# Patient Record
Sex: Female | Born: 1975 | Race: White | Hispanic: Yes | Marital: Married | Smoking: Former smoker
Health system: Southern US, Community
[De-identification: ages and names within clinical notes are randomized; demographics above are authoritative.]

## PROBLEM LIST (undated history)

## (undated) ENCOUNTER — Inpatient Hospital Stay (HOSPITAL_COMMUNITY): Payer: Self-pay

## (undated) DIAGNOSIS — Z789 Other specified health status: Secondary | ICD-10-CM

## (undated) DIAGNOSIS — N83209 Unspecified ovarian cyst, unspecified side: Secondary | ICD-10-CM

## (undated) DIAGNOSIS — G43909 Migraine, unspecified, not intractable, without status migrainosus: Secondary | ICD-10-CM

---

## 1999-05-03 ENCOUNTER — Inpatient Hospital Stay (HOSPITAL_COMMUNITY): Admission: AD | Admit: 1999-05-03 | Discharge: 1999-05-03 | Payer: Self-pay | Admitting: Obstetrics

## 1999-05-03 ENCOUNTER — Encounter: Payer: Self-pay | Admitting: Obstetrics & Gynecology

## 1999-05-11 ENCOUNTER — Encounter (HOSPITAL_COMMUNITY): Admission: RE | Admit: 1999-05-11 | Discharge: 1999-06-10 | Payer: Self-pay | Admitting: *Deleted

## 1999-06-08 ENCOUNTER — Inpatient Hospital Stay (HOSPITAL_COMMUNITY): Admission: AD | Admit: 1999-06-08 | Discharge: 1999-06-11 | Payer: Self-pay | Admitting: *Deleted

## 1999-06-15 ENCOUNTER — Inpatient Hospital Stay (HOSPITAL_COMMUNITY): Admission: AD | Admit: 1999-06-15 | Discharge: 1999-06-15 | Payer: Self-pay | Admitting: *Deleted

## 2000-10-16 ENCOUNTER — Encounter: Payer: Self-pay | Admitting: Emergency Medicine

## 2000-10-16 ENCOUNTER — Ambulatory Visit (HOSPITAL_COMMUNITY): Admission: AD | Admit: 2000-10-16 | Discharge: 2000-10-16 | Payer: Self-pay | Admitting: Obstetrics & Gynecology

## 2012-11-26 ENCOUNTER — Emergency Department (HOSPITAL_COMMUNITY)
Admission: EM | Admit: 2012-11-26 | Discharge: 2012-11-26 | Disposition: A | Discharge status: Home / Self Care | Payer: Self-pay | Source: Home / Self Care

## 2013-01-09 ENCOUNTER — Encounter (HOSPITAL_COMMUNITY): Payer: Self-pay | Admitting: Cardiology

## 2013-01-09 ENCOUNTER — Emergency Department (HOSPITAL_COMMUNITY): Payer: Medicaid Other

## 2013-01-09 ENCOUNTER — Emergency Department (HOSPITAL_COMMUNITY)
Admission: EM | Admit: 2013-01-09 | Discharge: 2013-01-09 | Disposition: A | Discharge status: Home / Self Care | Payer: Medicaid Other | Attending: Emergency Medicine | Admitting: Emergency Medicine

## 2013-01-09 DIAGNOSIS — O9989 Other specified diseases and conditions complicating pregnancy, childbirth and the puerperium: Secondary | ICD-10-CM | POA: Insufficient documentation

## 2013-01-09 DIAGNOSIS — Z3490 Encounter for supervision of normal pregnancy, unspecified, unspecified trimester: Secondary | ICD-10-CM

## 2013-01-09 DIAGNOSIS — R109 Unspecified abdominal pain: Secondary | ICD-10-CM | POA: Insufficient documentation

## 2013-01-09 LAB — CBC WITH DIFFERENTIAL/PLATELET
Eosinophils Absolute: 0.1 10*3/uL (ref 0.0–0.7)
Hemoglobin: 12.2 g/dL (ref 12.0–15.0)
Lymphocytes Relative: 30 % (ref 12–46)
Lymphs Abs: 2.2 10*3/uL (ref 0.7–4.0)
MCH: 29.2 pg (ref 26.0–34.0)
MCV: 84.9 fL (ref 78.0–100.0)
Monocytes Relative: 5 % (ref 3–12)
Neutrophils Relative %: 63 % (ref 43–77)
Platelets: 209 10*3/uL (ref 150–400)
RBC: 4.18 MIL/uL (ref 3.87–5.11)
WBC: 7.3 10*3/uL (ref 4.0–10.5)

## 2013-01-09 LAB — URINALYSIS, ROUTINE W REFLEX MICROSCOPIC
Bilirubin Urine: NEGATIVE
Ketones, ur: NEGATIVE mg/dL
Nitrite: NEGATIVE
Protein, ur: NEGATIVE mg/dL
Specific Gravity, Urine: 1.02 (ref 1.005–1.030)
Urobilinogen, UA: 0.2 mg/dL (ref 0.0–1.0)

## 2013-01-09 LAB — WET PREP, GENITAL: Trich, Wet Prep: NONE SEEN

## 2013-01-09 LAB — COMPREHENSIVE METABOLIC PANEL
ALT: 26 U/L (ref 0–35)
Alkaline Phosphatase: 72 U/L (ref 39–117)
BUN: 8 mg/dL (ref 6–23)
CO2: 22 mEq/L (ref 19–32)
GFR calc Af Amer: >90 mL/min (ref >90)
GFR calc non Af Amer: >90 mL/min (ref >90)
Glucose, Bld: 102 mg/dL — ABNORMAL HIGH (ref 70–99)
Potassium: 3.8 mEq/L (ref 3.5–5.1)
Sodium: 138 mEq/L (ref 135–145)
Total Bilirubin: 0.5 mg/dL (ref 0.3–1.2)

## 2013-01-09 LAB — URINE MICROSCOPIC-ADD ON

## 2013-01-09 LAB — POCT PREGNANCY, URINE: Preg Test, Ur: POSITIVE — AB

## 2013-01-09 LAB — ABO/RH

## 2013-01-09 LAB — LIPASE, BLOOD: Lipase: 31 U/L (ref 11–59)

## 2013-01-09 MED ORDER — MORPHINE SULFATE 4 MG/ML IJ SOLN
4.0000 mg | Freq: Once | INTRAMUSCULAR | Status: AC
  Administered 2013-01-09: 4 mg via INTRAVENOUS
  Filled 2013-01-09: qty 1

## 2013-01-09 MED ORDER — PRENATAL COMPLETE 14-0.4 MG PO TABS: 1.0000 | ORAL_TABLET | Freq: Every day | ORAL | Status: DC

## 2013-01-09 MED ORDER — ONDANSETRON HCL 4 MG/2ML IJ SOLN
4.0000 mg | Freq: Once | INTRAMUSCULAR | Status: AC
  Administered 2013-01-09: 4 mg via INTRAVENOUS
  Filled 2013-01-09: qty 2

## 2013-01-09 MED ORDER — IOHEXOL 300 MG/ML  SOLN
20.0000 mL | INTRAMUSCULAR | Status: AC
  Administered 2013-01-09: 25 mL via ORAL

## 2013-01-09 MED ORDER — SODIUM CHLORIDE 0.9 % IV BOLUS (SEPSIS)
1000.0000 mL | Freq: Once | INTRAVENOUS | Status: AC
  Administered 2013-01-09: 1000 mL via INTRAVENOUS

## 2013-01-09 NOTE — ED Provider Notes (Signed)
History     CSN: [id number]  Arrival date & time 01/09/13  0818   First MD Initiated Contact with Patient 01/09/13 [phone]      Chief Complaint  Patient presents with  . Abdominal Pain    (Consider location/radiation/quality/duration/timing/severity/associated sxs/prior treatment) HPI Comments: Patient presents with diffuse abdominal pain that started this morning. It is in the center of her abdomen and radiates down her right side. She denies any dysuria or hematuria. She denies any nausea, vomiting or stool changes. No fever. She recently started taking birth control attempt to regulate her periods. She denies any provoking or palliating factors. She denies any chest pain or shortness of breath. She's never had pain like this before. There appears to be no association with food.  The history is provided by the patient and a relative. The history is limited by a language barrier.    History reviewed. No pertinent past medical history.  History reviewed. No pertinent past surgical history.  History reviewed. No pertinent family history.  History  Substance Use Topics  . Smoking status: Never Smoker   . Smokeless tobacco: Not on file  . Alcohol Use: No    OB History   Grav Para Term Preterm Abortions TAB SAB Ect Mult Living                  Review of Systems  Constitutional: Positive for activity change and appetite change. Negative for fever.  HENT: Negative for congestion and rhinorrhea.   Respiratory: Negative for cough, chest tightness and shortness of breath.   Cardiovascular: Negative for chest pain.  Gastrointestinal: Positive for abdominal pain. Negative for nausea, vomiting and diarrhea.  Genitourinary: Negative for dysuria, hematuria, vaginal bleeding and vaginal discharge.  Musculoskeletal: Positive for back pain.  Neurological: Negative for headaches.  A complete 10 system review of systems was obtained and all systems are negative except as noted in the HPI  and PMH.    Allergies  Review of patient's allergies indicates no known allergies.  Home Medications   Current Outpatient Rx  Name  Route  Sig  Dispense  Refill  . Norethindrone-Eth Estradiol (NORTREL 1/35, 28, PO)   Oral   Take 1 tablet by mouth daily.         . Ranitidine HCl (ZANTAC PO)   Oral   Take 1-2 tablets by mouth daily as needed. For stomach.           BP 125/77  Pulse 58  Temp(Src) 98.2 F (36.8 C) (Oral)  SpO2 100%  Physical Exam  Constitutional: She is oriented to person, place, and time. She appears well-developed and well-nourished. No distress.  HENT:  Head: Normocephalic and atraumatic.  Mouth/Throat: No oropharyngeal exudate.  Eyes: Conjunctivae and EOM are normal. Pupils are equal, round, and reactive to light.  Neck: Normal range of motion. Neck supple.  Cardiovascular: Normal rate, regular rhythm and normal heart sounds.   No murmur heard. Pulmonary/Chest: Effort normal and breath sounds normal. No respiratory distress.  Abdominal: Soft. There is tenderness. There is no rebound and no guarding.  TTP epigastrum, RLQ. No guarding or rebound  Genitourinary: No vaginal discharge found.  Cervix closed, no CMT. Mild L adnexal tenderness, no masses  Musculoskeletal: Normal range of motion. She exhibits no edema and no tenderness.  Neurological: She is alert and oriented to person, place, and time. No cranial nerve deficit. She exhibits normal muscle tone. Coordination normal.  Skin: Skin is warm.    ED Course  Procedures (including critical care time)  Labs Reviewed  CBC WITH DIFFERENTIAL - Abnormal; Notable for the following:    HCT 35.5 (*)    All other components within normal limits  COMPREHENSIVE METABOLIC PANEL - Abnormal; Notable for the following:    Glucose, Bld 102 (*)    All other components within normal limits  URINALYSIS, ROUTINE W REFLEX MICROSCOPIC - Abnormal; Notable for the following:    APPearance HAZY (*)    Hgb urine  dipstick LARGE (*)    All other components within normal limits  URINE MICROSCOPIC-ADD ON - Abnormal; Notable for the following:    Squamous Epithelial / LPF FEW (*)    Bacteria, UA FEW (*)    All other components within normal limits  POCT PREGNANCY, URINE - Abnormal; Notable for the following:    Preg Test, Ur POSITIVE (*)    All other components within normal limits  WET PREP, GENITAL  GC/CHLAMYDIA PROBE AMP  LIPASE, BLOOD  HCG, QUANTITATIVE, PREGNANCY  HCG, QUANTITATIVE, PREGNANCY  ABO/RH   US Ob Comp Less 14 Wks  01/09/2013  *RADIOLOGY REPORT*  Clinical Data: Abdominal pain.  Vaginal bleeding.  5-week-6-day gestational age by LMP.  Previous history of ectopic pregnancy.  OBSTETRIC <14 WK [place] AND TRANSVAGINAL OB US  Technique:  Both transabdominal and transvaginal ultrasound examinations were performed for complete evaluation of the gestation as well as the maternal uterus, adnexal regions, and pelvic cul-de-sac.  Transvaginal technique was performed to assess early pregnancy.  Comparison:  None.  Findings: No gestational sac or other fluid collections seen within the endometrial cavity.  Endometrial thickness measures 12 mm transvaginally.  No fibroids identified.  Prior C-section scar noted.  The left ovary is normal in appearance.  A simple right ovarian cyst is seen measuring 2.6 cm.  No suspicious adnexal masses are identified there is no evidence of free fluid.  IMPRESSION: No IUP or adnexal mass identified.  Differential diagnosis includes recent spontaneous abortion, IUP too early to visualize, and occult ectopic pregnancy.  Recommend close follow-up of quantitative B-HCG levels, and followup US as clinically warranted.   Original Report Authenticated By: Myles Rosenthal, M.D.    US Ob Transvaginal  01/09/2013  *RADIOLOGY REPORT*  Clinical Data: Abdominal pain.  Vaginal bleeding.  5-week-6-day gestational age by LMP.  Previous history of ectopic pregnancy.  OBSTETRIC <14 WK [place] AND  TRANSVAGINAL OB US  Technique:  Both transabdominal and transvaginal ultrasound examinations were performed for complete evaluation of the gestation as well as the maternal uterus, adnexal regions, and pelvic cul-de-sac.  Transvaginal technique was performed to assess early pregnancy.  Comparison:  None.  Findings: No gestational sac or other fluid collections seen within the endometrial cavity.  Endometrial thickness measures 12 mm transvaginally.  No fibroids identified.  Prior C-section scar noted.  The left ovary is normal in appearance.  A simple right ovarian cyst is seen measuring 2.6 cm.  No suspicious adnexal masses are identified there is no evidence of free fluid.  IMPRESSION: No IUP or adnexal mass identified.  Differential diagnosis includes recent spontaneous abortion, IUP too early to visualize, and occult ectopic pregnancy.  Recommend close follow-up of quantitative B-HCG levels, and followup US as clinically warranted.   Original Report Authenticated By: Myles Rosenthal, M.D.      No diagnosis found.    MDM  Patient with diffuse abdominal pain that started this morning it radiates across her entire abdomen. No other symptoms. Recently started birth control to regulate  her periods.  She has right lower quadrant tenderness on exam and is in her epigastrium. There is no guarding or rebound. Patient's UA is negative. Her pregnancy test was found to be positive.  No IUP or mass seen on ultrasound.  Abdominal pain has resolved. Patient's quant is 147. She's had an ectopic in the past. Discussed with Dr. Clearance Coots as well as Dr. Tamela Oddi. the patient will need repeat hCG in 2 days. Patient and family express understanding through the translator that this may be an ectopic pregnancy, a miscarriage, or a normal pregnancy and followup is needed.      Glynn Octave, MD 01/09/13 [phone]

## 2013-01-09 NOTE — ED Notes (Signed)
Pt reports generalized abd pain that started this morning. Reports that the pain started in the center of abd and radiated around into her back. Denies any urinary symptoms.

## 2013-01-09 NOTE — ED Notes (Addendum)
Called lab, states have specimen to run hCG.

## 2013-01-09 NOTE — ED Notes (Addendum)
Spoke with mini lab about green tube lab collected today, they will found and run the HCG.

## 2013-01-11 LAB — GC/CHLAMYDIA PROBE AMP
CT Probe RNA: NEGATIVE
GC Probe RNA: NEGATIVE

## 2013-04-01 ENCOUNTER — Ambulatory Visit: Payer: Medicaid Other | Attending: Orthopedic Surgery | Admitting: Occupational Therapy

## 2013-04-01 DIAGNOSIS — M6281 Muscle weakness (generalized): Secondary | ICD-10-CM | POA: Insufficient documentation

## 2013-04-01 DIAGNOSIS — IMO0001 Reserved for inherently not codable concepts without codable children: Secondary | ICD-10-CM | POA: Insufficient documentation

## 2013-04-01 DIAGNOSIS — M25549 Pain in joints of unspecified hand: Secondary | ICD-10-CM | POA: Insufficient documentation

## 2013-04-15 ENCOUNTER — Ambulatory Visit: Payer: Medicaid Other | Admitting: Occupational Therapy

## 2013-04-23 ENCOUNTER — Ambulatory Visit: Payer: Medicaid Other | Admitting: Occupational Therapy

## 2013-04-30 ENCOUNTER — Ambulatory Visit: Payer: Medicaid Other | Attending: Orthopedic Surgery | Admitting: Occupational Therapy

## 2013-04-30 DIAGNOSIS — M25549 Pain in joints of unspecified hand: Secondary | ICD-10-CM | POA: Insufficient documentation

## 2013-04-30 DIAGNOSIS — IMO0001 Reserved for inherently not codable concepts without codable children: Secondary | ICD-10-CM | POA: Insufficient documentation

## 2013-04-30 DIAGNOSIS — M6281 Muscle weakness (generalized): Secondary | ICD-10-CM | POA: Insufficient documentation

## 2013-08-26 ENCOUNTER — Other Ambulatory Visit: Payer: Self-pay | Admitting: Orthopedic Surgery

## 2013-08-26 DIAGNOSIS — R2231 Localized swelling, mass and lump, right upper limb: Secondary | ICD-10-CM

## 2013-08-28 ENCOUNTER — Ambulatory Visit
Admission: RE | Admit: 2013-08-28 | Discharge: 2013-08-28 | Disposition: A | Discharge status: Home / Self Care | Payer: Medicaid Other | Source: Ambulatory Visit | Attending: Orthopedic Surgery | Admitting: Orthopedic Surgery

## 2013-08-28 DIAGNOSIS — R2231 Localized swelling, mass and lump, right upper limb: Secondary | ICD-10-CM

## 2013-08-28 MED ORDER — GADOBENATE DIMEGLUMINE 529 MG/ML IV SOLN
15.0000 mL | Freq: Once | INTRAVENOUS | Status: AC | PRN
  Administered 2013-08-28: 15 mL via INTRAVENOUS

## 2014-04-10 IMAGING — US US OB COMP LESS 14 WK
1 series · 14 of 28 positions shown · non-contrast
Comparison: None.

CLINICAL DATA: Abdominal pain.  Vaginal bleeding.  5-week-8-day
gestational age by LMP.  Previous history of ectopic pregnancy.

OBSTETRIC <14 WK US AND TRANSVAGINAL OB US
TECHNIQUE: Both transabdominal and transvaginal ultrasound
examinations were performed for complete evaluation of the
gestation as well as the maternal uterus, adnexal regions, and
pelvic cul-de-sac.  Transvaginal technique was performed to assess
early pregnancy.

[Series 1: us ob comp less 14 wk · 0.28mm/px · 14 of 68 slices shown]
[im 3/68]
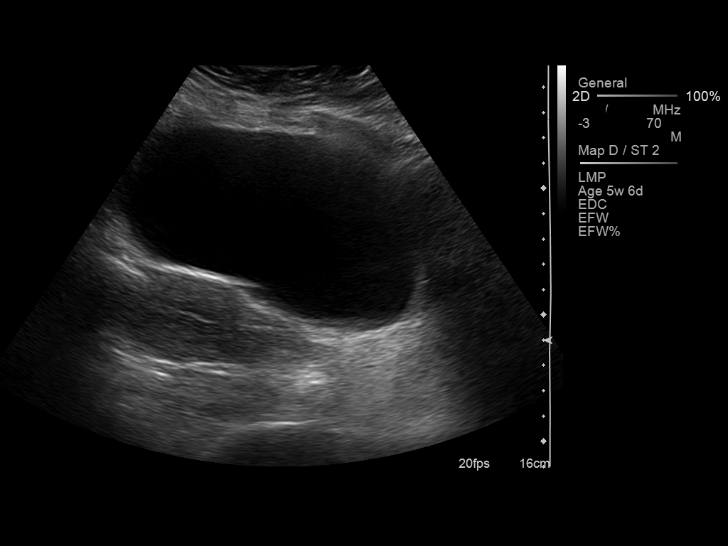
[im 8/68]
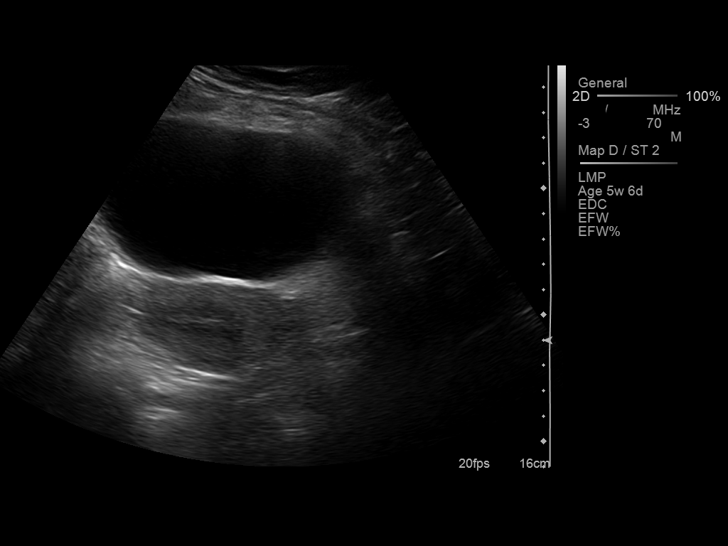
[im 13/68]
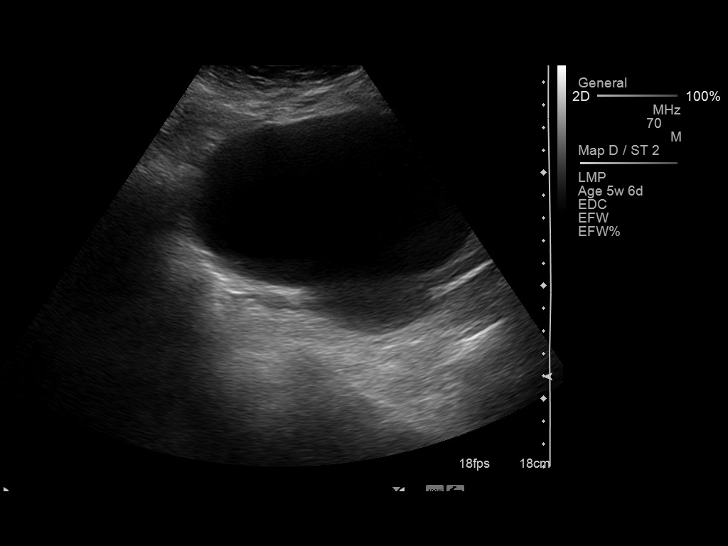
[im 18/68]
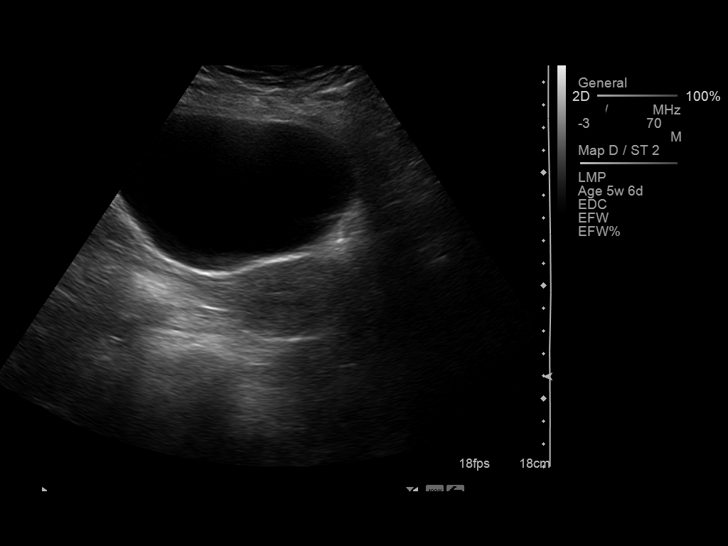
[im 23/68]
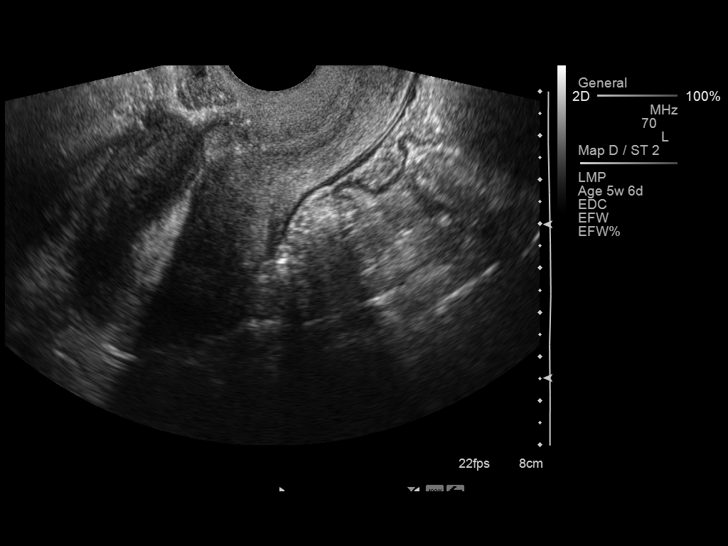
[im 28/68]
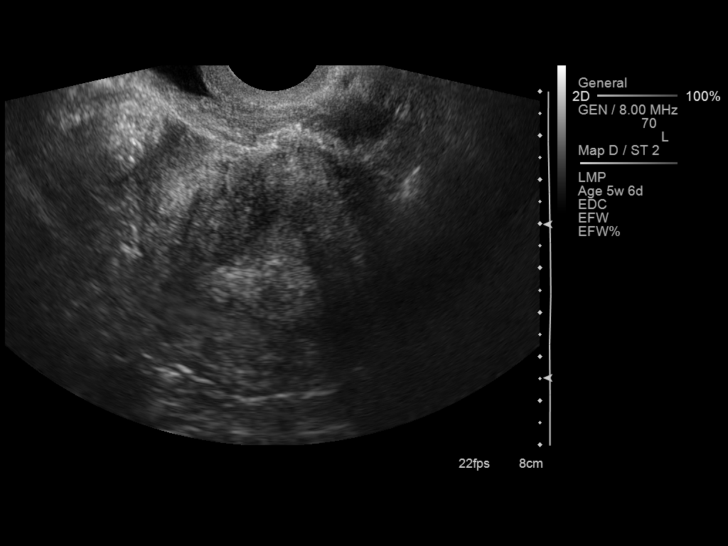
[im 33/68]
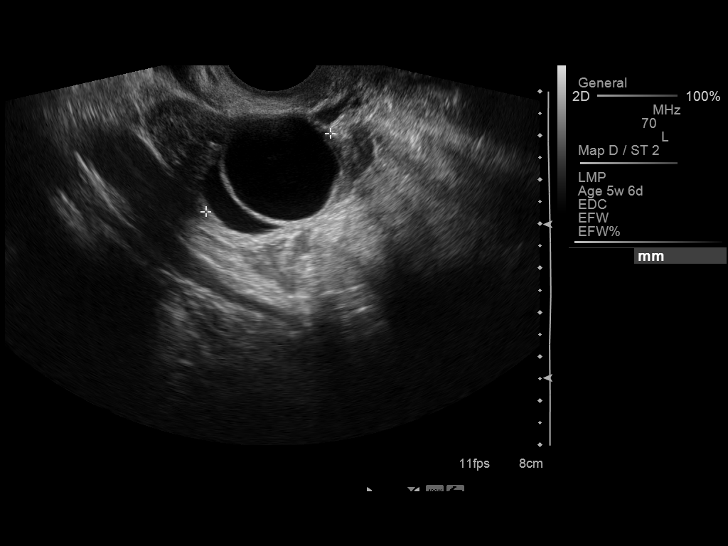
[im 38/68]
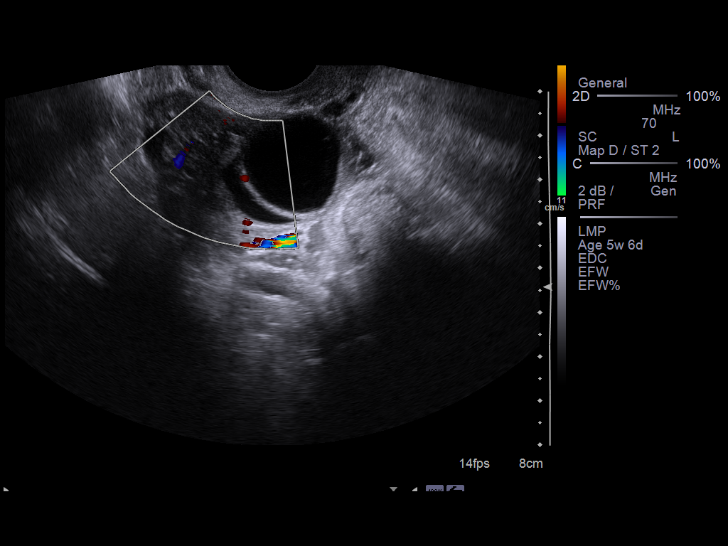
[im 43/68]
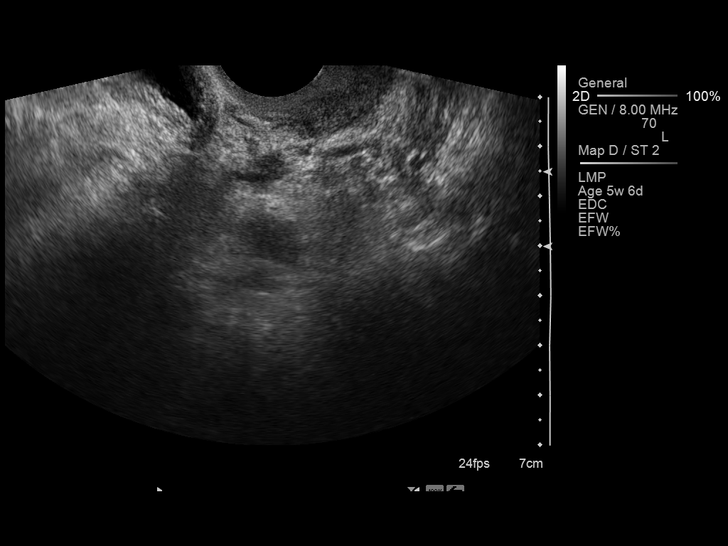
[im 48/68]
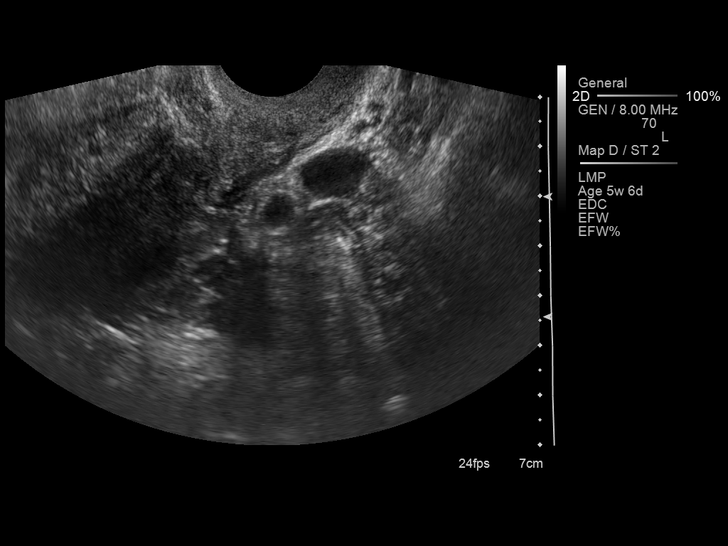
[im 53/68]
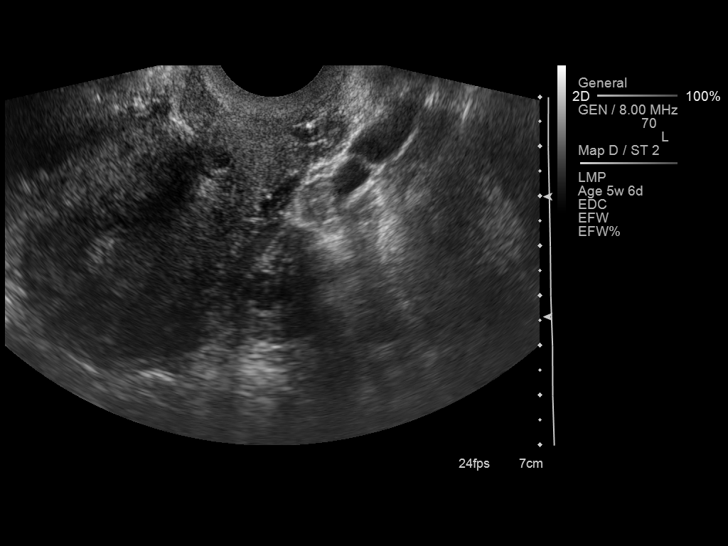
[im 58/68]
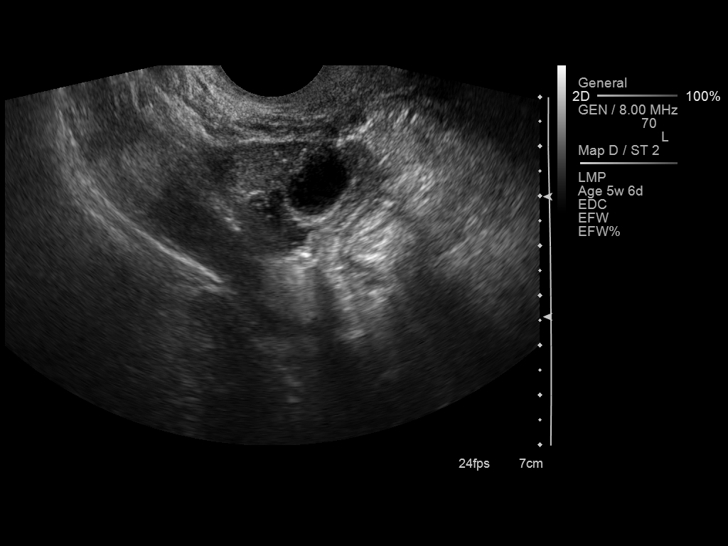
[im 63/68]
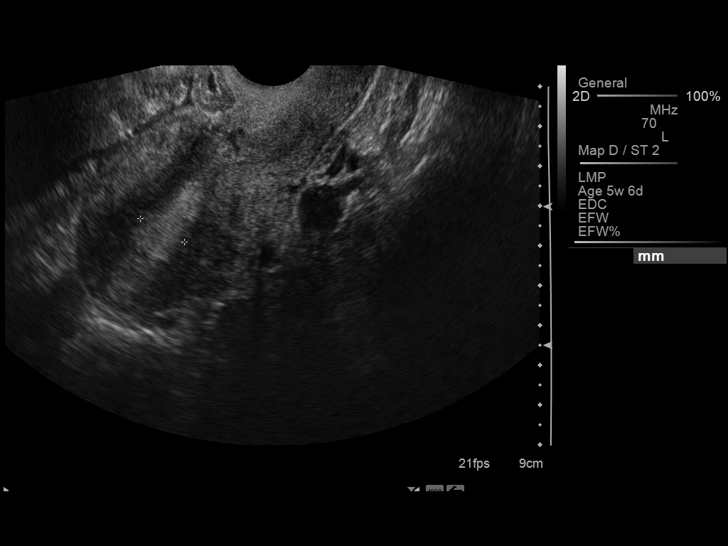
[im 68/68]
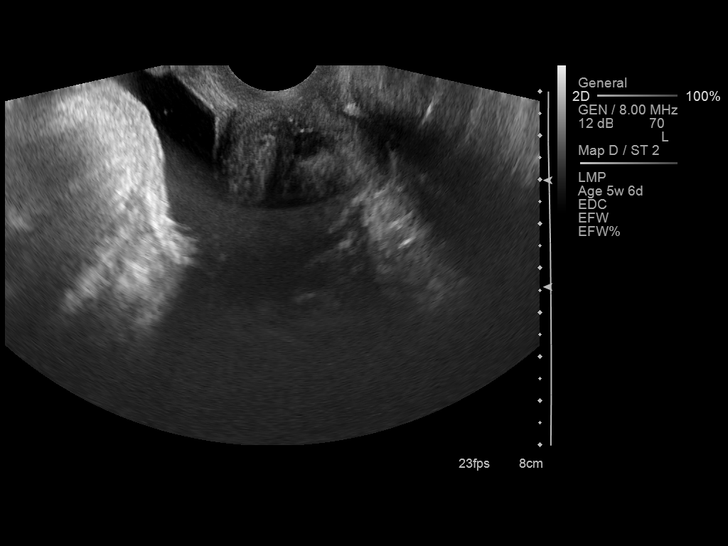

[14 of 28 positions shown; findings below may reference images not displayed]

FINDINGS: No gestational sac or other fluid collections seen within
the endometrial cavity.  Endometrial thickness measures 12 mm
transvaginally.  No fibroids identified.  Prior C-section scar
noted.

The left ovary is normal in appearance.  A simple right ovarian
cyst is seen measuring 2.6 cm.  No suspicious adnexal masses are
identified there is no evidence of free fluid.
IMPRESSION: No IUP or adnexal mass identified.  Differential diagnosis includes
recent spontaneous abortion, IUP too early to visualize, and occult
ectopic pregnancy.  Recommend close follow-up of quantitative B-HCG
levels, and followup US as clinically warranted.

## 2015-05-22 ENCOUNTER — Inpatient Hospital Stay (HOSPITAL_COMMUNITY): Payer: Medicaid Other

## 2015-05-22 ENCOUNTER — Inpatient Hospital Stay (HOSPITAL_COMMUNITY)
Admission: AD | Admit: 2015-05-22 | Discharge: 2015-05-22 | Disposition: A | Discharge status: Home / Self Care | Payer: Medicaid Other | Source: Ambulatory Visit | Attending: Obstetrics and Gynecology | Admitting: Obstetrics and Gynecology

## 2015-05-22 ENCOUNTER — Emergency Department (HOSPITAL_COMMUNITY)
Admission: EM | Admit: 2015-05-22 | Discharge: 2015-05-22 | Discharge status: Absent without leave | Payer: Medicaid Other

## 2015-05-22 ENCOUNTER — Encounter (HOSPITAL_COMMUNITY): Payer: Self-pay | Admitting: *Deleted

## 2015-05-22 DIAGNOSIS — R109 Unspecified abdominal pain: Secondary | ICD-10-CM

## 2015-05-22 DIAGNOSIS — Z3A21 21 weeks gestation of pregnancy: Secondary | ICD-10-CM | POA: Insufficient documentation

## 2015-05-22 DIAGNOSIS — O9989 Other specified diseases and conditions complicating pregnancy, childbirth and the puerperium: Secondary | ICD-10-CM | POA: Diagnosis not present

## 2015-05-22 DIAGNOSIS — Z87891 Personal history of nicotine dependence: Secondary | ICD-10-CM | POA: Diagnosis not present

## 2015-05-22 DIAGNOSIS — O26872 Cervical shortening, second trimester: Secondary | ICD-10-CM

## 2015-05-22 DIAGNOSIS — R1030 Lower abdominal pain, unspecified: Secondary | ICD-10-CM | POA: Diagnosis present

## 2015-05-22 DIAGNOSIS — O26899 Other specified pregnancy related conditions, unspecified trimester: Secondary | ICD-10-CM

## 2015-05-22 DIAGNOSIS — O26879 Cervical shortening, unspecified trimester: Secondary | ICD-10-CM | POA: Insufficient documentation

## 2015-05-22 HISTORY — DX: Unspecified ovarian cyst, unspecified side: N83.209

## 2015-05-22 HISTORY — DX: Other specified health status: Z78.9

## 2015-05-22 LAB — CBC
HCT: 33.1 % — ABNORMAL LOW (ref 36.0–46.0)
HEMOGLOBIN: 11.4 g/dL — AB (ref 12.0–15.0)
MCH: 29.5 pg (ref 26.0–34.0)
MCHC: 34.4 g/dL (ref 30.0–36.0)
MCV: 85.8 fL (ref 78.0–100.0)
Platelets: 186 10*3/uL (ref 150–400)
RBC: 3.86 MIL/uL — ABNORMAL LOW (ref 3.87–5.11)
RDW: 15.9 % — AB (ref 11.5–15.5)
WBC: 8.4 10*3/uL (ref 4.0–10.5)

## 2015-05-22 LAB — URINALYSIS, ROUTINE W REFLEX MICROSCOPIC
Bilirubin Urine: NEGATIVE
Glucose, UA: NEGATIVE mg/dL
HGB URINE DIPSTICK: NEGATIVE
Ketones, ur: 15 mg/dL — AB
Leukocytes, UA: NEGATIVE
NITRITE: NEGATIVE
PROTEIN: NEGATIVE mg/dL
Specific Gravity, Urine: 1.02 (ref 1.005–1.030)
UROBILINOGEN UA: 0.2 mg/dL (ref 0.0–1.0)
pH: 5.5 (ref 5.0–8.0)

## 2015-05-22 NOTE — ED Notes (Signed)
Called x's 3 with no response

## 2015-05-22 NOTE — Discharge Instructions (Signed)
Dolor abdominal durante el embarazo °(Abdominal Pain During Pregnancy) °El dolor de vientre (abdominal) es habitual durante el embarazo. Generalmente no se trata de un problema grave. Otras veces puede ser un signo de que algo no anda bien. Siempre comuníquese con su médico si tiene dolor abdominal. °CUIDADOS EN EL HOGAR °Controle el dolor para ver si hay cambios. Las indicaciones que siguen pueden ayudarla a sentirse mejor: °· Notenga sexo (relaciones sexuales) ni se coloque nada dentro de la vagina hasta que se sienta mejor. °· Haga reposo hasta que el dolor se calme. °· Si siente ganas de vomitar (náuseas ) beba líquidos claros. No consuma alimentos sólidos hasta que se sienta mejor. °· Sólo tome los medicamentos que le haya indicado su médico. °· Cumpla con las visitas al médico según las indicaciones. °SOLICITE AYUDA DE INMEDIATO SI:  °· Tiene un sangrado, pierde líquido o elimina trozos de tejido por la vagina. °· Siente más dolor o cólicos. °· Comienza a vomitar. °· Siente dolor al orinar u observa sangre en la orina. °· Tiene fiebre. °· No siente que el bebé se mueva mucho. °· Se siente muy débil o cree que va a desmayarse. °· Tiene dificultad para respirar con o sin dolor en el vientre. °· Siente un dolor de cabeza muy intenso y dolor en el vientre. °· Observa que sale un líquido por la vagina y tiene dolor abdominal. °· La materia fecal es líquida (diarrea). °· El dolor en el viente no desaparece, o empeora, luego de hacer reposo. °ASEGÚRESE DE QUE:  °· Comprende estas instrucciones. °· Controlará su afección. °· Recibirá ayuda de inmediato si no mejora o si empeora. °Document Released: 07/27/2011 Document Revised: 07/17/2013 °ExitCare® Patient Information ©2015 [employer]. This information is not intended to replace advice given to you by your health care provider. Make sure you discuss any questions you have with your health care provider. ° °

## 2015-05-22 NOTE — MAU Provider Note (Signed)
History     CSN: [id number]  Arrival date and time: 05/22/15 1530   First Provider Initiated Contact with Patient 05/22/15 1706      Chief Complaint  Patient presents with  . Abdominal Pain   HPI  Caitlin Velasquez is a 39 y.o. [id number] at [redacted]w[redacted]d here with report of lower abdominal pain after eating.  Pain is described as sharp and intermittent in nature.  Uterus tightens up and she feels a pressure that gravitates up abdomen.  Denies nausea, vomiting, diarrhea, vaginal bleeding, leaking of fluid, UTI symptoms, or abnormal vaginal discharge.  Receives prenatal care in [place] with Dr. Shawnie Pons.  Hx of four previous csections with no report of problems this pregnancy.     Past Medical History  Diagnosis Date  . Medical history non-contributory   . Ovarian cyst     Past Surgical History  Procedure Laterality Date  . Cesarean section      No family history on file.  History  Substance Use Topics  . Smoking status: Former Smoker    Quit date: 05/21/2012  . Smokeless tobacco: Not on file  . Alcohol Use: No    Allergies: No Known Allergies  Prescriptions prior to admission  Medication Sig Dispense Refill Last Dose  . Prenatal Vit-Fe Fumarate-FA (PRENATAL COMPLETE) 14-0.4 MG TABS Take 1 tablet by mouth daily. 60 each 0 05/22/2015 at Unknown time    Review of Systems  Constitutional: Negative for fever and chills.  Gastrointestinal: Positive for abdominal pain. Negative for nausea, vomiting, diarrhea and constipation.  Genitourinary: Negative for dysuria, urgency and frequency.  All other systems reviewed and are negative.  Physical Exam   Last menstrual period 12/02/2014.  Physical Exam  Constitutional: She is oriented to person, place, and time. She appears well-developed and well-nourished.  HENT:  Head: Normocephalic.  Neck: Normal range of motion. Neck supple.  Cardiovascular: Normal rate, regular rhythm and normal heart sounds.   Respiratory: Effort  normal and breath sounds normal.  GI: Soft. She exhibits no mass. There is tenderness (along mdiline of abdomen). There is no rebound and no guarding.  Genitourinary: No bleeding in the vagina. Vaginal discharge (mucusy) found.  Neurological: She is alert and oriented to person, place, and time.  Skin: Skin is warm and dry.   FHR 142    Dilation: Closed Effacement (%): Thick Cervical Position: Posterior Exam by:: Margarita Mail, CNM    MAU Course  Procedures Results for orders placed or performed during the hospital encounter of 05/22/15 (from the past 24 hour(s))  Urinalysis, Routine w reflex microscopic (not at [facility])     Status: Abnormal   Collection Time: 05/22/15  3:45 PM  Result Value Ref Range   Color, Urine YELLOW YELLOW   APPearance CLEAR CLEAR   Specific Gravity, Urine 1.020 1.005 - 1.030   pH 5.5 5.0 - 8.0   Glucose, UA NEGATIVE NEGATIVE mg/dL   Hgb urine dipstick NEGATIVE NEGATIVE   Bilirubin Urine NEGATIVE NEGATIVE   Ketones, ur 15 (A) NEGATIVE mg/dL   Protein, ur NEGATIVE NEGATIVE mg/dL   Urobilinogen, UA 0.2 0.0 - 1.0 mg/dL   Nitrite NEGATIVE NEGATIVE   Leukocytes, UA NEGATIVE NEGATIVE   CBC    Component Value Date/Time   WBC 8.4 05/22/2015 1746   RBC 3.86* 05/22/2015 1746   HGB 11.4* 05/22/2015 1746   HCT 33.1* 05/22/2015 1746   PLT 186 05/22/2015 1746   MCV 85.8 05/22/2015 1746   MCH 29.5 05/22/2015 1746  MCHC 34.4 05/22/2015 1746   RDW 15.9* 05/22/2015 1746   LYMPHSABS 2.2 01/09/2013 0833   MONOABS 0.4 01/09/2013 0833   EOSABS 0.1 01/09/2013 0833   BASOSABS 0.0 01/09/2013 0833    Cervical length 4.72  Assessment and Plan  39 y.o. [id number] at [redacted]w[redacted]d IUP Abdominal Pain in Pregnancy - Normal Exam  Plan: Discharge to home Pregnancy precautions Keep scheduled appt with Dr. Hiram Comber Kennith Gain, CNM

## 2015-05-22 NOTE — MAU Note (Signed)
Pt stated after she eats she gets a pain in her lower stomach that then makes her stomach hard and her  feels hot all over. Has had this feeling for about 2 weeks. Pt just found out she was pregnant this week.

## 2015-12-08 DIAGNOSIS — Z23 Encounter for immunization: Secondary | ICD-10-CM

## 2015-12-08 NOTE — Congregational Nurse Program (Signed)
Congregational Nurse Program Note  Date of Encounter: 12/08/2015  Past Medical History: Past Medical History  Diagnosis Date  . Medical history non-contributory   . Ovarian cyst     Encounter Details:     CNP Questionnaire - 12/08/15 1754    Patient Demographics   Is this a new or existing patient? New   Patient is considered a/an Immigrant   Race Latino/Hispanic   Patient Assistance   Location of Patient Assistance [facility]   Patient's financial/insurance status Medicaid   Uninsured Patient No   Patient referred to apply for the following financial assistance Medicaid   Food insecurities addressed Provided food supplies   Transportation assistance No   Assistance securing medications No   Educational health offerings Hypertension;Behavioral health   Encounter Details   Primary purpose of visit Other   Was an Emergency Department visit averted? Not Applicable   Does patient have a medical provider? No   Patient referred to Clinic;Doctor referral for a non-emergent behavioral health crisis   Was a mental health screening completed? (GAINS tool) No  post partum depression screening, gyn completed one today per client   Does patient have dental issues? No   Since previous encounter, have you referred patient for abnormal blood pressure that resulted in a new diagnosis or medication change? No   Since previous encounter, have you referred patient for abnormal blood glucose that resulted in a new diagnosis or medication change? No     vs  161/99 pulse 84 this afternoon.  Pt. Comes for flu shot which was given.  Brought 16 yr. Old daughter.  Did not administer flu shot to her. She has a pediatrician at [facility].  BP is high today. Client takes no medications.  Is crying and upset.  Lots of financial issues and hardships. Husband is in prison. Client is not working. [facility] staff is working with her about these problems.  16 yr. Speaks English, mother not. There  is also an 11 yr. Old in the home.  Mother breastfeeding.  Says she left the baby which is 2 mos. Old with a neighbor.  Mother is breastfeeding.   Dr. Donzetta Kohutore is Gynecologist and a screening (mother could not say the name of it) was done today at her post partum visit.  Referred to [facility] to receive ongoing healthcare.  She said she will call for an appt.  Gave name,address and flyer on [facility] location to client. RN mentioned post partum depresssion and its' seriousness and follow up with MD is very important.

## 2016-03-26 ENCOUNTER — Encounter (HOSPITAL_COMMUNITY): Payer: Self-pay | Admitting: *Deleted

## 2016-08-20 IMAGING — US US MFM OB TRANSVAGINAL
1 series · 14 of 19 positions shown · non-contrast
Comparison: none

[Series 1: us ob transvaginal · 19 acquisitions, 14 frames shown]
[im 1/19]
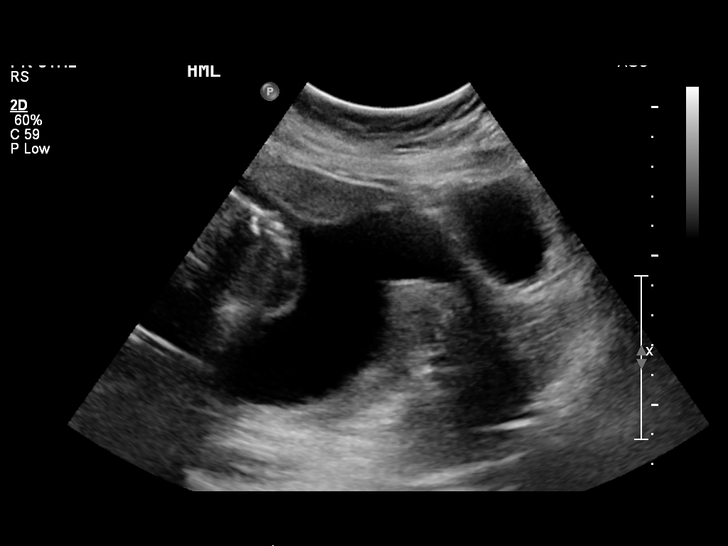
[im 3/19]
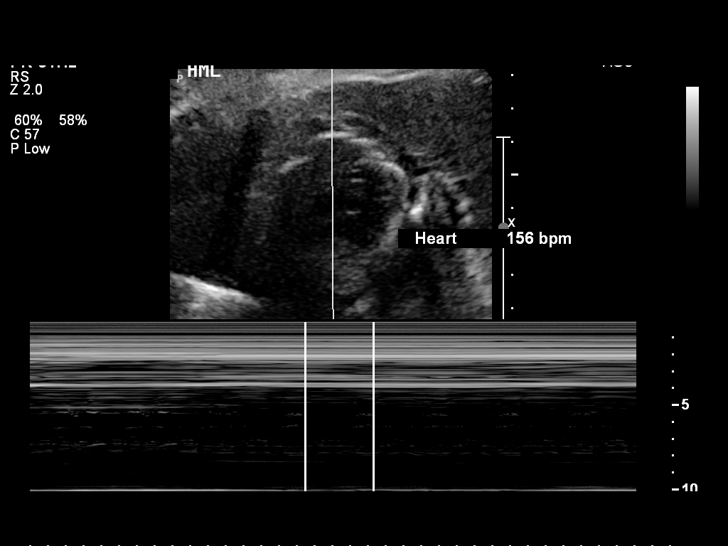
[im 4/19]
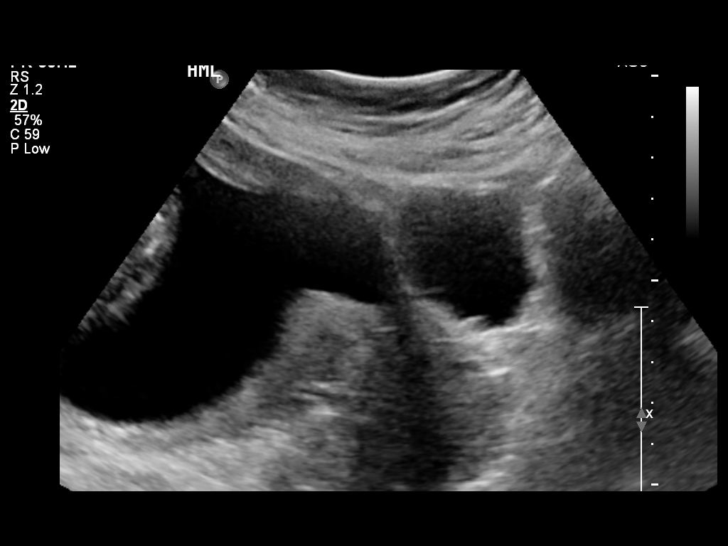
[im 5/19]
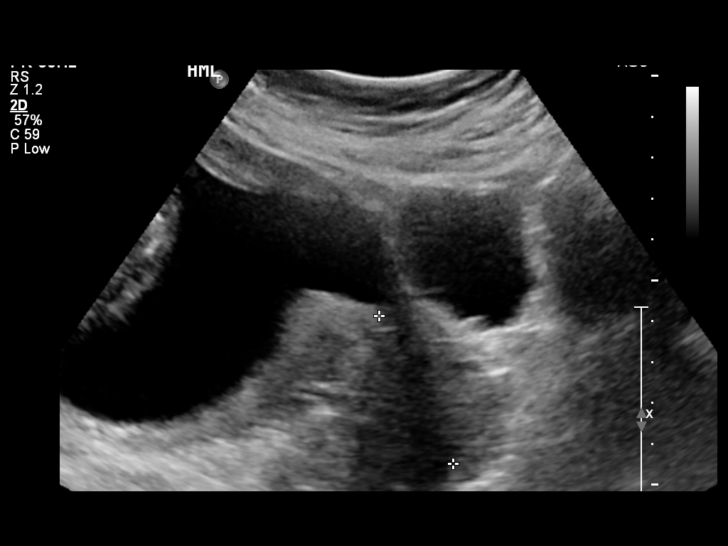
[im 7/19]
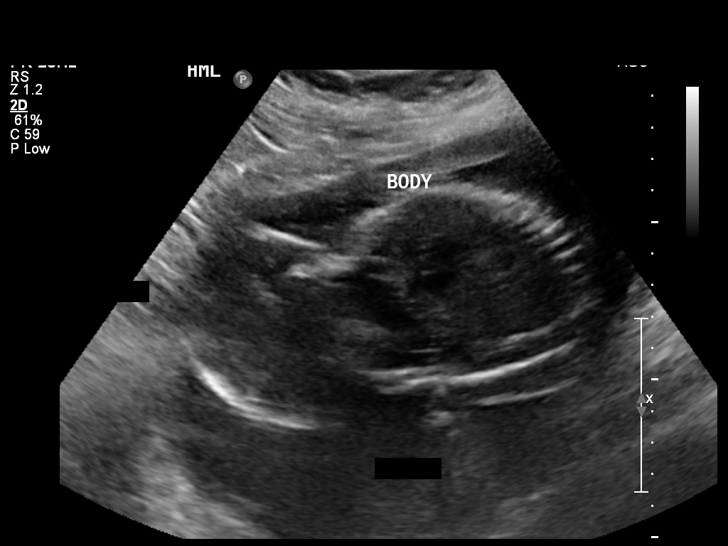
[im 8/19]
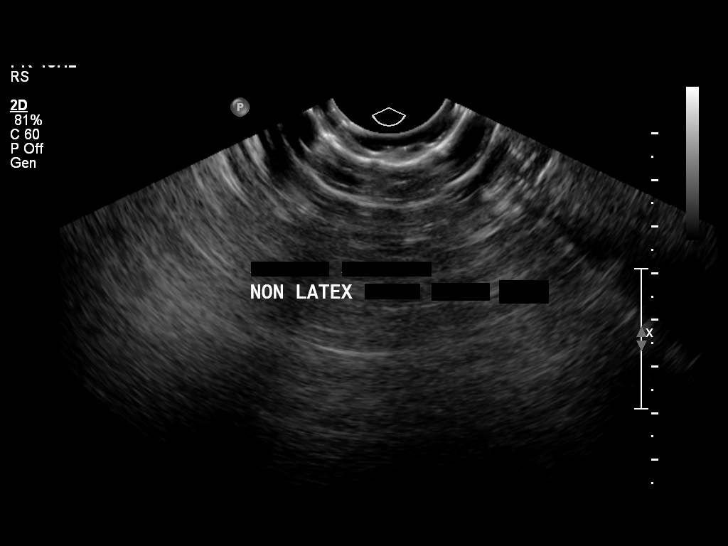
[im 9/19]
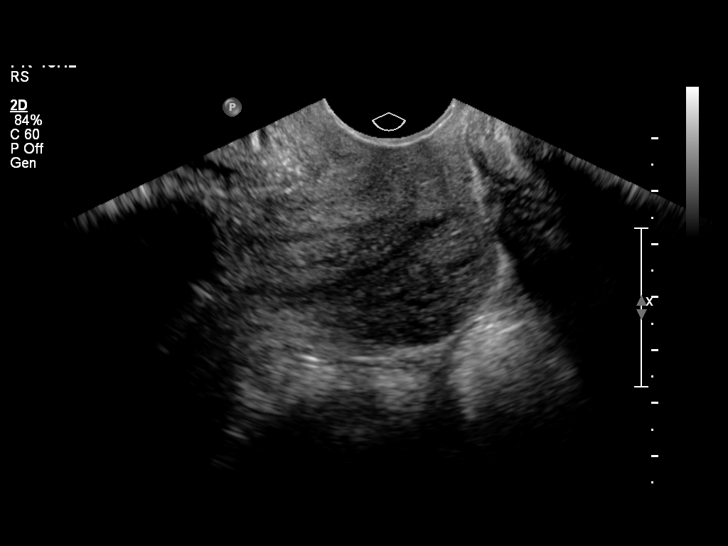
[im 11/19]
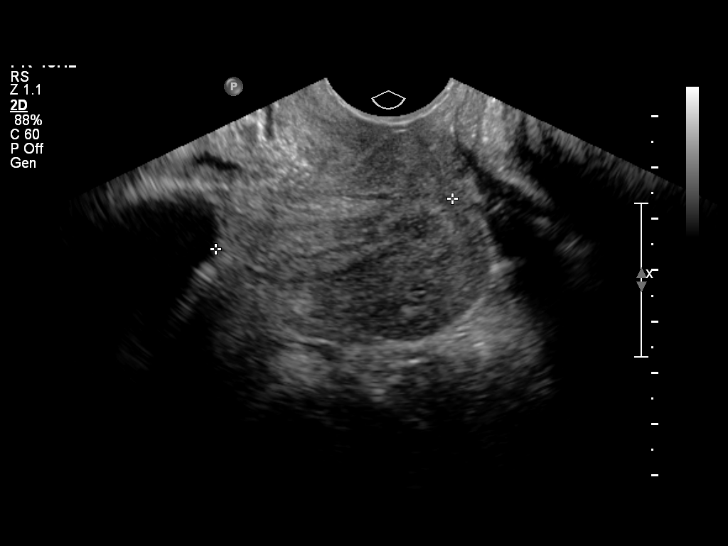
[im 12/19]
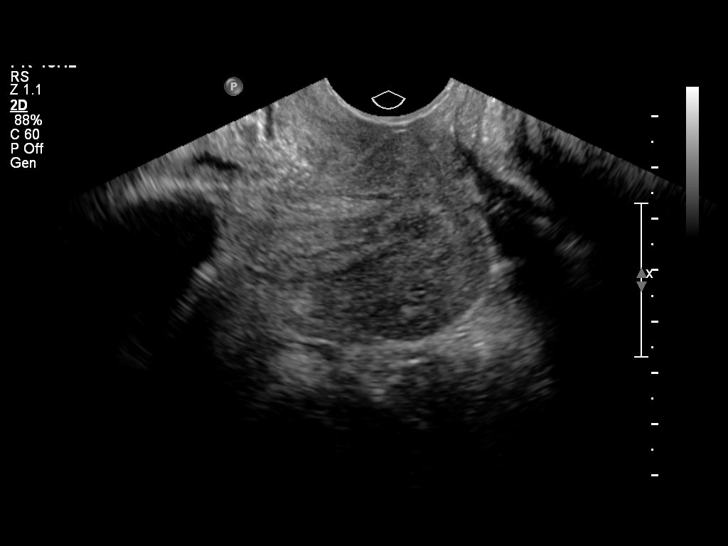
[im 13/19]
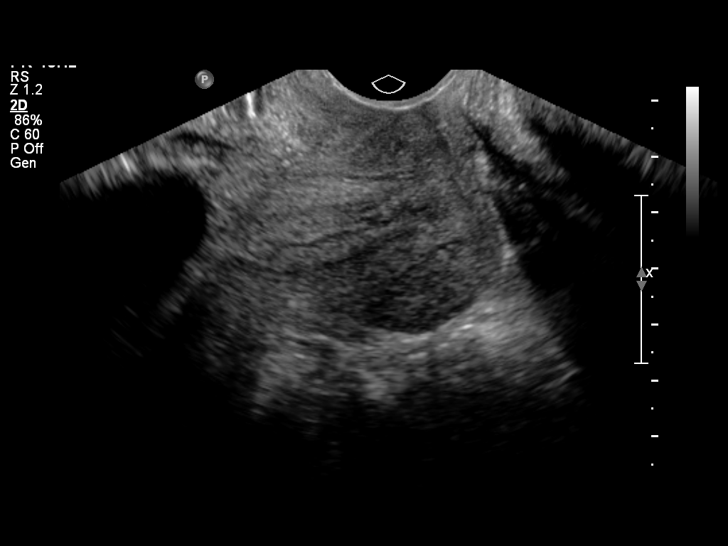
[im 15/19]
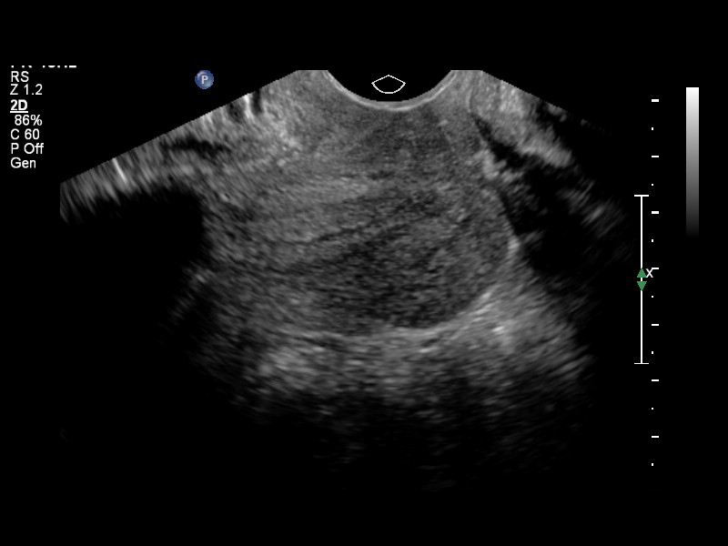
[im 16/19]
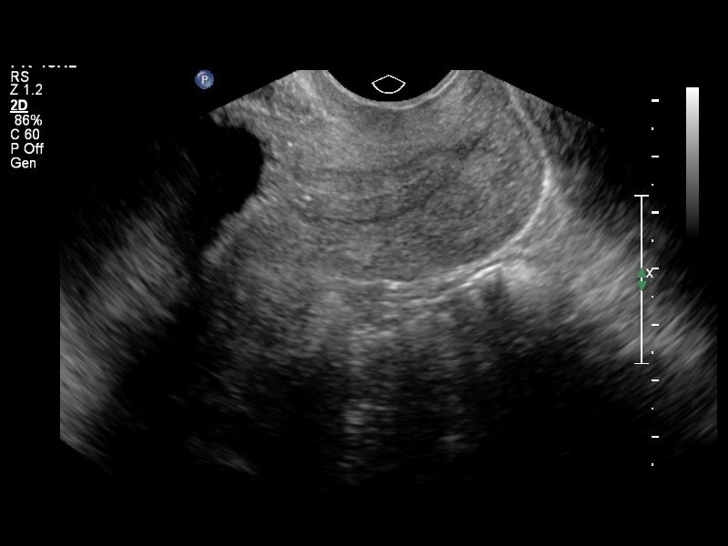
[im 17/19]
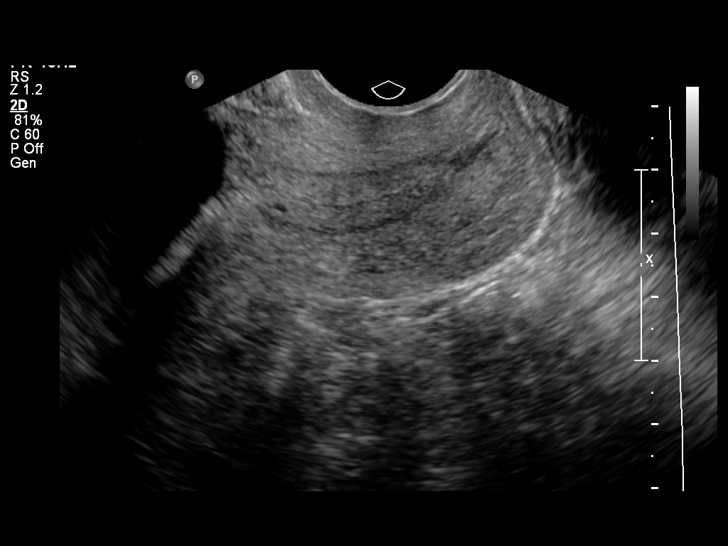
[im 19/19]
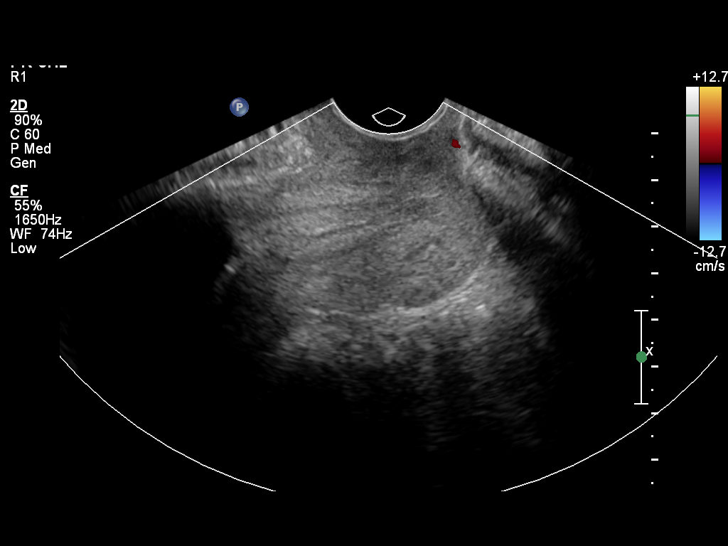

[14 of 19 positions shown; findings below may reference images not displayed]

OBSTETRICS REPORT
(Signed Final 05/22/2015 [DATE])

Service(s) Provided

US MFM OB TRANSVAGINAL                                76817.2
Indications

Abdominal pain in pregnancy
Cervical shortening, second trimester
21 weeks gestation of pregnancy
Fetal Evaluation

Num Of Fetuses:    1
Fetal Heart Rate:  156                          bpm
Cardiac Activity:  Observed
Presentation:      Transverse, head to
maternal left
Gestational Age

LMP:           24w 3d        Date:  12/02/14                 EDD:   09/08/15
Clinical EDD:  21w 2d                                        EDD:   09/30/15
Best:          21w 2d     Det. By:  Clinical EDD             EDD:   09/30/15
Cervix Uterus Adnexa

Cervical Length:    4.72      cm

Cervix:       Normal appearance by transvaginal scan
Impression

SIUP at 21+2 weeks
EV views of cervix: normal length without funneling
Recommendations

Follow-up as clinically indicated

## 2017-10-23 ENCOUNTER — Ambulatory Visit (HOSPITAL_COMMUNITY): Admission: EM | Admit: 2017-10-23 | Discharge: 2017-10-23 | Discharge status: Against Medical Advice | Payer: Self-pay

## 2018-07-06 DIAGNOSIS — H1013 Acute atopic conjunctivitis, bilateral: Secondary | ICD-10-CM | POA: Diagnosis not present

## 2018-07-06 DIAGNOSIS — H5203 Hypermetropia, bilateral: Secondary | ICD-10-CM | POA: Diagnosis not present

## 2019-09-22 ENCOUNTER — Other Ambulatory Visit: Payer: Self-pay

## 2019-09-22 ENCOUNTER — Ambulatory Visit (HOSPITAL_COMMUNITY)
Admission: EM | Admit: 2019-09-22 | Discharge: 2019-09-22 | Disposition: A | Discharge status: Home / Self Care | Payer: Medicaid Other | Attending: Urgent Care | Admitting: Urgent Care

## 2019-09-22 ENCOUNTER — Encounter (HOSPITAL_COMMUNITY): Payer: Self-pay

## 2019-09-22 DIAGNOSIS — L03311 Cellulitis of abdominal wall: Secondary | ICD-10-CM

## 2019-09-22 DIAGNOSIS — R109 Unspecified abdominal pain: Secondary | ICD-10-CM

## 2019-09-22 MED ORDER — NAPROXEN 500 MG PO TABS: 500.0000 mg | ORAL_TABLET | Freq: Two times a day (BID) | ORAL | 0 refills | Status: DC

## 2019-09-22 MED ORDER — DOXYCYCLINE HYCLATE 100 MG PO CAPS: 100.0000 mg | ORAL_CAPSULE | Freq: Two times a day (BID) | ORAL | 0 refills | Status: DC

## 2019-09-22 NOTE — ED Triage Notes (Signed)
Patient presents to Urgent Care with complaints of abscess on right abdomen since 5 days ago. Patient reports it is not open or draining.

## 2019-09-22 NOTE — ED Provider Notes (Signed)
MRN: [id number] DOB: Dec 31, 1975  Subjective:   Caitlin Velasquez is a 43 y.o. female presenting for 5-day history of persistent redness and pain of the right lower side of her abdomen.  Patient states that she was home remedies to try and help her infection but has not had any improvement.  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  She comes into contact with many dirty linens and also has to use harsh chemicals, states that her hands are irritated from this.  Denies fever, nausea, vomiting, drainage of pus or bleeding.  No current facility-administered medications for this encounter.   Current Outpatient Medications:  .  Prenatal Vit-Fe Fumarate-FA (PRENATAL COMPLETE) 14-0.4 MG TABS, Take 1 tablet by mouth daily., Disp: 60 each, Rfl: 0   No Known Allergies  Past Medical History:  Diagnosis Date  . Medical history non-contributory   . Ovarian cyst      Past Surgical History:  Procedure Laterality Date  . CESAREAN SECTION      ROS  Objective:   Vitals: BP (!) 146/86 (BP Location: Right Arm)   Pulse 76   Temp 98.6 F (37 C)   SpO2 100%   Physical Exam Constitutional:      General: She is not in acute distress.    Appearance: Normal appearance. She is well-developed and normal weight. She is not ill-appearing, toxic-appearing or diaphoretic.  HENT:     Head: Normocephalic and atraumatic.     Right Ear: External ear normal.     Left Ear: External ear normal.     Nose: Nose normal.     Mouth/Throat:     Mouth: Mucous membranes are moist.     Pharynx: Oropharynx is clear.  Eyes:     General: No scleral icterus.    Extraocular Movements: Extraocular movements intact.     Pupils: Pupils are equal, round, and reactive to light.  Cardiovascular:     Rate and Rhythm: Normal rate and regular rhythm.     Pulses: Normal pulses.     Heart sounds: Normal heart sounds. No murmur. No friction rub. No gallop.   Pulmonary:     Effort:  Pulmonary effort is normal. No respiratory distress.     Breath sounds: Normal breath sounds. No stridor. No wheezing, rhonchi or rales.  Abdominal:     General: Bowel sounds are normal. There is no distension.     Palpations: Abdomen is soft. There is no mass.     Tenderness: There is abdominal tenderness (about her cellulitis only). There is no right CVA tenderness, left CVA tenderness, guarding or rebound.  Skin:    General: Skin is warm and dry.     Coloration: Skin is not pale.     Findings: No rash.       Neurological:     General: No focal deficit present.     Mental Status: She is alert and oriented to person, place, and time.  Psychiatric:        Mood and Affect: Mood normal.        Behavior: Behavior normal.        Thought Content: Thought content normal.        Judgment: Judgment normal.     Assessment and Plan :   1. Cellulitis of abdominal wall   2. Abdominal wall pain     Will cover for cellulitis with doxycycline, warm compresses.  Use naproxen for pain and inflammation.  Wound is not amenable to I&D.  We will have patient maintain close follow-up and counseled on signs and symptoms warranting recheck for consideration of an I&D.  Recommended patient use hydrocortisone over-the-counter for what I suspect is developing dyshidrotic eczema.  Abided with work note for rest for the next couple of days. Counseled patient on potential for adverse effects with medications prescribed/recommended today, ER and return-to-clinic precautions discussed, patient verbalized understanding.    Jaynee Eagles, [place] 09/22/19 1731

## 2019-10-23 DIAGNOSIS — H25013 Cortical age-related cataract, bilateral: Secondary | ICD-10-CM | POA: Diagnosis not present

## 2019-10-23 DIAGNOSIS — H16223 Keratoconjunctivitis sicca, not specified as Sjogren's, bilateral: Secondary | ICD-10-CM | POA: Diagnosis not present

## 2019-10-23 DIAGNOSIS — H40013 Open angle with borderline findings, low risk, bilateral: Secondary | ICD-10-CM | POA: Diagnosis not present

## 2019-10-23 DIAGNOSIS — H2513 Age-related nuclear cataract, bilateral: Secondary | ICD-10-CM | POA: Diagnosis not present

## 2019-11-05 DIAGNOSIS — H2513 Age-related nuclear cataract, bilateral: Secondary | ICD-10-CM | POA: Diagnosis not present

## 2019-11-05 DIAGNOSIS — H1045 Other chronic allergic conjunctivitis: Secondary | ICD-10-CM | POA: Diagnosis not present

## 2019-11-05 DIAGNOSIS — H52203 Unspecified astigmatism, bilateral: Secondary | ICD-10-CM | POA: Diagnosis not present

## 2019-11-05 DIAGNOSIS — H40013 Open angle with borderline findings, low risk, bilateral: Secondary | ICD-10-CM | POA: Diagnosis not present

## 2019-11-05 DIAGNOSIS — J300 Vasomotor rhinitis: Secondary | ICD-10-CM | POA: Diagnosis not present

## 2019-11-05 DIAGNOSIS — H16223 Keratoconjunctivitis sicca, not specified as Sjogren's, bilateral: Secondary | ICD-10-CM | POA: Diagnosis not present

## 2019-11-05 DIAGNOSIS — H5203 Hypermetropia, bilateral: Secondary | ICD-10-CM | POA: Diagnosis not present

## 2019-11-05 DIAGNOSIS — H25013 Cortical age-related cataract, bilateral: Secondary | ICD-10-CM | POA: Diagnosis not present

## 2019-11-05 DIAGNOSIS — H524 Presbyopia: Secondary | ICD-10-CM | POA: Diagnosis not present

## 2021-05-07 ENCOUNTER — Other Ambulatory Visit: Payer: Self-pay

## 2021-05-07 ENCOUNTER — Encounter (HOSPITAL_COMMUNITY): Payer: Self-pay

## 2021-05-07 ENCOUNTER — Ambulatory Visit (HOSPITAL_COMMUNITY)
Admission: EM | Admit: 2021-05-07 | Discharge: 2021-05-07 | Disposition: A | Discharge status: Home / Self Care | Payer: Medicaid Other | Attending: Urgent Care | Admitting: Urgent Care

## 2021-05-07 DIAGNOSIS — J0100 Acute maxillary sinusitis, unspecified: Secondary | ICD-10-CM

## 2021-05-07 DIAGNOSIS — Z87891 Personal history of nicotine dependence: Secondary | ICD-10-CM | POA: Diagnosis not present

## 2021-05-07 DIAGNOSIS — Z20822 Contact with and (suspected) exposure to covid-19: Secondary | ICD-10-CM | POA: Diagnosis not present

## 2021-05-07 DIAGNOSIS — H9203 Otalgia, bilateral: Secondary | ICD-10-CM | POA: Diagnosis not present

## 2021-05-07 DIAGNOSIS — J3489 Other specified disorders of nose and nasal sinuses: Secondary | ICD-10-CM | POA: Diagnosis not present

## 2021-05-07 DIAGNOSIS — R519 Headache, unspecified: Secondary | ICD-10-CM | POA: Diagnosis present

## 2021-05-07 LAB — SARS CORONAVIRUS 2 (TAT 6-24 HRS): SARS Coronavirus 2: NEGATIVE

## 2021-05-07 LAB — POCT RAPID STREP A, ED / UC: Streptococcus, Group A Screen (Direct): NEGATIVE

## 2021-05-07 MED ORDER — AMOXICILLIN-POT CLAVULANATE 875-125 MG PO TABS: 1.0000 | ORAL_TABLET | Freq: Two times a day (BID) | ORAL | 0 refills | Status: DC

## 2021-05-07 MED ORDER — CETIRIZINE HCL 10 MG PO TABS: 10.0000 mg | ORAL_TABLET | Freq: Every day | ORAL | 0 refills | Status: AC

## 2021-05-07 MED ORDER — PSEUDOEPHEDRINE HCL 60 MG PO TABS: 60.0000 mg | ORAL_TABLET | Freq: Three times a day (TID) | ORAL | 0 refills | Status: DC | PRN

## 2021-05-07 NOTE — ED Triage Notes (Signed)
Pt reports headache x 2 weeks; sore throat, bilateral ear pain and feeling dizzy since this morning.

## 2021-05-07 NOTE — ED Provider Notes (Signed)
[facility]   MRN: [id number] DOB: 1976/08/01  Subjective:   Caitlin Velasquez is a 45 y.o. female presenting for 2-week history of persistent headache, dizziness, throat pain, bilateral ear fullness and pain, bilateral ear itching, started feeling dizzy this morning.  Patient has been using over-the-counter medications with minimal relief.  Denies confusion, chest pain, shortness of breath, coughing.  Denies having any fevers.  She is not currently taking any medications.    No Known Allergies  Past Medical History:  Diagnosis Date   Medical history non-contributory    Ovarian cyst      Past Surgical History:  Procedure Laterality Date   CESAREAN SECTION     CESAREAN SECTION      Family History  Problem Relation Age of Onset   Diabetes Mother    Hypertension Mother    Hypertension Father     Social History   Tobacco Use   Smoking status: Former    Pack years: 0.00    Types: Cigarettes    Quit date: 05/21/2012    Years since quitting: 8.9   Smokeless tobacco: Never  Substance Use Topics   Alcohol use: No   Drug use: No    ROS   Objective:   Vitals: BP (!) 145/99 (BP Location: Right Arm)   Pulse 86   Temp 100.3 F (37.9 C) (Oral)   Resp 20   SpO2 96%   Breastfeeding No   Physical Exam Constitutional:      General: She is not in acute distress.    Appearance: Normal appearance. She is well-developed. She is not ill-appearing, toxic-appearing or diaphoretic.  HENT:     Head: Normocephalic and atraumatic.     Right Ear: Ear canal and external ear normal.     Left Ear: Ear canal and external ear normal.     Ears:     Comments: TM slightly injected bilaterally.    Nose: Congestion and rhinorrhea present.     Comments: Right-sided maxillary sinus tenderness.    Mouth/Throat:     Mouth: Mucous membranes are moist.  Eyes:     General: No scleral icterus.       Right eye: No discharge.        Left eye: No discharge.      Extraocular Movements: Extraocular movements intact.     Conjunctiva/sclera: Conjunctivae normal.     Pupils: Pupils are equal, round, and reactive to light.  Cardiovascular:     Rate and Rhythm: Normal rate and regular rhythm.     Pulses: Normal pulses.     Heart sounds: Normal heart sounds. No murmur heard.   No friction rub. No gallop.  Pulmonary:     Effort: Pulmonary effort is normal. No respiratory distress.     Breath sounds: Normal breath sounds. No stridor. No wheezing, rhonchi or rales.  Skin:    General: Skin is warm and dry.     Findings: No rash.  Neurological:     Mental Status: She is alert and oriented to person, place, and time.     Cranial Nerves: No cranial nerve deficit.     Motor: No weakness.     Coordination: Coordination normal.     Gait: Gait normal.     Deep Tendon Reflexes: Reflexes normal.     Comments: Negative Kernig and Brudzinski.  Psychiatric:        Mood and Affect: Mood normal.        Behavior: Behavior  normal.        Thought Content: Thought content normal.        Judgment: Judgment normal.    Results for orders placed or performed during the hospital encounter of 05/07/21 (from the past 24 hour(s))  POCT Rapid Strep A (ED/UC)     Status: None   Collection Time: 05/07/21  1:13 PM  Result Value Ref Range   Streptococcus, Group A Screen (Direct) NEGATIVE NEGATIVE    Assessment and Plan :   PDMP not reviewed this encounter.  1. Acute non-recurrent maxillary sinusitis   2. Stuffy and runny nose   3. Acute ear pain, bilateral     COVID-19 testing pending. Will start empiric treatment for sinusitis with Augmentin.  Recommended supportive care otherwise including the use of oral antihistamine, decongestant. Counseled patient on potential for adverse effects with medications prescribed/recommended today, ER and return-to-clinic precautions discussed, patient verbalized understanding.    Wallis Bamberg, PA-C 05/07/21 1356

## 2021-05-09 LAB — CULTURE, GROUP A STREP (THRC)

## 2021-05-19 ENCOUNTER — Other Ambulatory Visit: Payer: Self-pay

## 2021-05-19 ENCOUNTER — Emergency Department (HOSPITAL_COMMUNITY)
Admission: EM | Admit: 2021-05-19 | Discharge: 2021-05-20 | Disposition: A | Discharge status: Home / Self Care | Payer: Medicaid Other | Attending: Emergency Medicine | Admitting: Emergency Medicine

## 2021-05-19 DIAGNOSIS — R1084 Generalized abdominal pain: Secondary | ICD-10-CM | POA: Diagnosis not present

## 2021-05-19 DIAGNOSIS — R232 Flushing: Secondary | ICD-10-CM | POA: Diagnosis not present

## 2021-05-19 DIAGNOSIS — R42 Dizziness and giddiness: Secondary | ICD-10-CM | POA: Diagnosis not present

## 2021-05-19 DIAGNOSIS — R112 Nausea with vomiting, unspecified: Secondary | ICD-10-CM | POA: Insufficient documentation

## 2021-05-19 DIAGNOSIS — R197 Diarrhea, unspecified: Secondary | ICD-10-CM | POA: Insufficient documentation

## 2021-05-19 DIAGNOSIS — R519 Headache, unspecified: Secondary | ICD-10-CM | POA: Diagnosis not present

## 2021-05-19 DIAGNOSIS — Z5321 Procedure and treatment not carried out due to patient leaving prior to being seen by health care provider: Secondary | ICD-10-CM | POA: Insufficient documentation

## 2021-05-19 MED ORDER — KETOROLAC TROMETHAMINE 30 MG/ML IJ SOLN
30.0000 mg | Freq: Once | INTRAMUSCULAR | Status: AC
  Administered 2021-05-19: 30 mg via INTRAMUSCULAR
  Filled 2021-05-19: qty 1

## 2021-05-19 MED ORDER — METOCLOPRAMIDE HCL 5 MG/ML IJ SOLN
10.0000 mg | Freq: Once | INTRAMUSCULAR | Status: AC
  Administered 2021-05-19: 10 mg via INTRAMUSCULAR
  Filled 2021-05-19: qty 2

## 2021-05-19 NOTE — ED Provider Notes (Signed)
MSE was initiated and I personally evaluated the patient and placed orders (if any) at  11:33 PM on May 19, 2021.  Patient without significant medical history presents with 2 days of headache and facial "heat", nausea, vomiting, mild diarrhea. Symptoms started after eating shrimp for dinner. She ate the meal with family and no one else is ill. No history of shrimp allergy. No rash, SOB, facial swelling. No fever. She reports history of similar headaches but not associated with other symptoms.   Today's Vitals   05/19/21 2208 05/19/21 2329  BP: 134/75   Pulse: 64   Resp: 16   Temp: 98.2 F (36.8 C)   TempSrc: Oral   SpO2: 100%   Weight:  101 kg  Height:  5\' 2"  (1.575 m)  PainSc:  0-No pain   Body mass index is 40.73 kg/m.  Well appearing,  No neuro deficits Abdomen benign.   The patient appears stable so that the remainder of the MSE may be completed by another provider.   , PA-C 05/20/21 05/22/21    0109, MD 05/20/21 [phone]

## 2021-05-19 NOTE — ED Triage Notes (Signed)
Patient presents with multiple complaints: headache with dizziness ; emesis ,diarrhea , mild generalized abdominal pain and hot flushes at face this week.

## 2021-05-20 ENCOUNTER — Other Ambulatory Visit: Payer: Self-pay

## 2021-05-20 ENCOUNTER — Encounter (HOSPITAL_COMMUNITY): Payer: Self-pay

## 2021-05-20 ENCOUNTER — Ambulatory Visit (HOSPITAL_COMMUNITY)
Admission: EM | Admit: 2021-05-20 | Discharge: 2021-05-20 | Disposition: A | Discharge status: Home / Self Care | Payer: Medicaid Other | Attending: Physician Assistant | Admitting: Physician Assistant

## 2021-05-20 DIAGNOSIS — M542 Cervicalgia: Secondary | ICD-10-CM

## 2021-05-20 DIAGNOSIS — I10 Essential (primary) hypertension: Secondary | ICD-10-CM

## 2021-05-20 DIAGNOSIS — R519 Headache, unspecified: Secondary | ICD-10-CM

## 2021-05-20 DIAGNOSIS — R030 Elevated blood-pressure reading, without diagnosis of hypertension: Secondary | ICD-10-CM | POA: Diagnosis not present

## 2021-05-20 HISTORY — DX: Migraine, unspecified, not intractable, without status migrainosus: G43.909

## 2021-05-20 LAB — URINALYSIS, ROUTINE W REFLEX MICROSCOPIC
Bilirubin Urine: NEGATIVE
Glucose, UA: NEGATIVE mg/dL
Hgb urine dipstick: NEGATIVE
Ketones, ur: 5 mg/dL — AB
Leukocytes,Ua: NEGATIVE
Nitrite: NEGATIVE
Protein, ur: NEGATIVE mg/dL
Specific Gravity, Urine: 1.027 (ref 1.005–1.030)
pH: 6 (ref 5.0–8.0)

## 2021-05-20 MED ORDER — PREDNISONE 20 MG PO TABS: 40.0000 mg | ORAL_TABLET | Freq: Every day | ORAL | 0 refills | Status: AC

## 2021-05-20 MED ORDER — BACLOFEN 10 MG PO TABS: 10.0000 mg | ORAL_TABLET | Freq: Two times a day (BID) | ORAL | 0 refills | Status: AC | PRN

## 2021-05-20 MED ORDER — AMLODIPINE BESYLATE 5 MG PO TABS: 5.0000 mg | ORAL_TABLET | Freq: Every day | ORAL | 0 refills | Status: AC

## 2021-05-20 NOTE — Discharge Instructions (Addendum)
I am concerned that your blood pressure is contributing to your headaches.  Please start amlodipine 5 mg daily.  This can make you dizzy so please be careful when taking it.  I have called in baclofen they can take twice a day as needed.  This will make you sleepy so do not drive or drink alcohol with it.  Start prednisone burst (40 mg x 3 days).  Do not take NSAIDs including aspirin, ibuprofen/Advil, naproxen/Aleve with this medication due to risk of GI bleeding.  If you continue to have headaches or develop any additional symptoms including chest pain, shortness of breath, leg swelling, vision changes you need to go to the ER.  Please follow-up with our clinic or PCP within a few days to ensure symptom improvement and improvement of blood pressure.  Avoid salt and decongestants.  Keep track of your blood pressure at home and keep log for evaluation of follow-up appointment.  If anything worsens please return for reevaluation.

## 2021-05-20 NOTE — ED Provider Notes (Signed)
[facility]    CSN: [id number] Arrival date & time: 05/20/21  1525      History   Chief Complaint Chief Complaint  Patient presents with   Headache    HPI Caitlin Velasquez is a 45 y.o. female.   Patient presents today with a weeklong history of intermittent headaches.  She is accompanied by her daughter who provided translation after they declined video interpreter.  She was seen by the emergency room in triage earlier today but did not say for visit.  She was given Toradol and Reglan with temporary improvement of symptoms.  She reports headache pain is rated 8 on a 0-10 pain scale, localized to posterior head with radiation throughout head, described as throbbing, no aggravating or alleviating factors identified.  She has tried several over-the-counter medications for symptom management without improvement.  She reports several episodes of nausea and vomiting a few days ago but these have since resolved; reports headache pain was already present prior to GI symptoms and GI symptoms have improved despite continued headaches.  She denies any current dizziness but states occasionally she will have a room spinning sensation without identifiable trigger.  She does have a history of migraines but has not taken medication for this condition in the past.  She has not seen a neurologist or had imaging.  She was recently treated for a sinus infection and states sinus symptoms have resolved but she continues to have headaches.  Denies any recent head trauma.  Denies any medication changes.  She was noted to have elevated blood pressure; denies history of hypertension and has not taken antihypertensive medications in the past.  She denies any chest pain, shortness of breath, peripheral edema, vision changes.   Past Medical History:  Diagnosis Date   Medical history non-contributory    Migraine    Ovarian cyst     Patient Active Problem List   Diagnosis Date Noted   Abdominal pain in  pregnancy, antepartum    Short cervical length during pregnancy    [redacted] weeks gestation of pregnancy     Past Surgical History:  Procedure Laterality Date   CESAREAN SECTION     CESAREAN SECTION      OB History     Gravida  7   Para  4   Term  4   Preterm      AB  2   Living  3      SAB  2   IAB      Ectopic      Multiple      Live Births  3            Home Medications    Prior to Admission medications   Medication Sig Start Date End Date Taking? Authorizing Provider  amLODipine (NORVASC) 5 MG tablet Take 1 tablet (5 mg total) by mouth daily. 05/20/21  Yes Brytani Voth K, PA-C  baclofen (LIORESAL) 10 MG tablet Take 1 tablet (10 mg total) by mouth 2 (two) times daily as needed for muscle spasms. 05/20/21  Yes Damisha Wolff K, PA-C  predniSONE (DELTASONE) 20 MG tablet Take 2 tablets (40 mg total) by mouth daily with breakfast for 3 days. 05/20/21 05/23/21 Yes Collene Massimino, Noberto RetortErin K, PA-C  cetirizine (ZYRTEC ALLERGY) 10 MG tablet Take 1 tablet (10 mg total) by mouth daily. 05/07/21   Wallis BambergMani, Mario, PA-C    Family History Family History  Problem Relation Age of Onset   Diabetes Mother  Hypertension Mother    Hypertension Father     Social History Social History   Tobacco Use   Smoking status: Former    Pack years: 0.00    Types: Cigarettes    Quit date: 05/21/2012    Years since quitting: 9.0   Smokeless tobacco: Never  Vaping Use   Vaping Use: Never used  Substance Use Topics   Alcohol use: No   Drug use: No     Allergies   Patient has no known allergies.   Review of Systems Review of Systems  Constitutional:  Positive for activity change. Negative for appetite change, fatigue and fever.  HENT:  Negative for congestion, sinus pressure, sneezing and sore throat.   Eyes:  Negative for photophobia and visual disturbance.  Respiratory:  Negative for cough and shortness of breath.   Cardiovascular:  Negative for chest pain.  Gastrointestinal:  Negative  for abdominal pain, diarrhea, nausea (resolved) and vomiting (resolved).  Musculoskeletal:  Negative for arthralgias and myalgias.  Neurological:  Positive for dizziness (intermittent) and headaches. Negative for light-headedness.    Physical Exam Triage Vital Signs ED Triage Vitals  Enc Vitals Group     BP 05/20/21 1630 (!) 166/82     Pulse Rate 05/20/21 1630 60     Resp 05/20/21 1630 18     Temp 05/20/21 1630 98.5 F (36.9 C)     Temp Source 05/20/21 1630 Oral     SpO2 05/20/21 1630 98 %     Weight --      Height --      Head Circumference --      Peak Flow --      Pain Score 05/20/21 1635 8     Pain Loc --      Pain Edu? --      Excl. in GC? --    No data found.  Updated Vital Signs BP (!) 166/82 (BP Location: Right Arm)   Pulse 60   Temp 98.5 F (36.9 C) (Oral)   Resp 18   SpO2 98%   Visual Acuity Right Eye Distance:   Left Eye Distance:   Bilateral Distance:    Right Eye Near:   Left Eye Near:    Bilateral Near:     Physical Exam Vitals reviewed.  Constitutional:      General: She is awake. She is not in acute distress.    Appearance: Normal appearance. She is normal weight. She is not ill-appearing.     Comments: Very pleasant female appears stated age in no acute distress  HENT:     Head: Normocephalic and atraumatic.     Right Ear: Tympanic membrane, ear canal and external ear normal.     Left Ear: Tympanic membrane, ear canal and external ear normal.     Nose: Nose normal.     Mouth/Throat:     Tongue: Tongue does not deviate from midline.     Pharynx: Uvula midline. No oropharyngeal exudate or posterior oropharyngeal erythema.  Eyes:     Extraocular Movements: Extraocular movements intact.     Conjunctiva/sclera: Conjunctivae normal.     Pupils: Pupils are equal, round, and reactive to light.  Cardiovascular:     Rate and Rhythm: Normal rate and regular rhythm.     Heart sounds: Normal heart sounds, S1 normal and S2 normal. No murmur  heard. Pulmonary:     Effort: Pulmonary effort is normal.     Breath sounds: Normal breath sounds. No wheezing, rhonchi  or rales.     Comments: Clear to auscultation bilaterally Abdominal:     Palpations: Abdomen is soft.     Tenderness: There is no abdominal tenderness.  Musculoskeletal:     Cervical back: Normal range of motion and neck supple. Tenderness present. No bony tenderness. No spinous process tenderness or muscular tenderness.     Thoracic back: No tenderness or bony tenderness.     Lumbar back: No tenderness or bony tenderness.     Comments: Strength 5/5 bilateral upper and lower extremities  Lymphadenopathy:     Head:     Right side of head: No submental, submandibular or tonsillar adenopathy.     Left side of head: No submental, submandibular or tonsillar adenopathy.  Neurological:     General: No focal deficit present.     Mental Status: She is alert and oriented to person, place, and time.     Cranial Nerves: Cranial nerves are intact.     Motor: Motor function is intact.     Coordination: Coordination is intact.     Gait: Gait is intact.     Comments: Cranial nerves II through XII intact.  No focal neurological defect on exam  Psychiatric:        Behavior: Behavior is cooperative.     UC Treatments / Results  Labs (all labs ordered are listed, but only abnormal results are displayed) Labs Reviewed - No data to display  EKG   Radiology No results found.  Procedures Procedures (including critical care time)  Medications Ordered in UC Medications - No data to display  Initial Impression / Assessment and Plan / UC Course  I have reviewed the triage vital signs and the nursing notes.  Pertinent labs & imaging results that were available during my care of the patient were reviewed by me and considered in my medical decision making (see chart for details).      Physical exam is reassuring today.  Discussed with utility of going to the emergency room  particularly given headache in the setting of hypertension patient declined to go back to the ER at this time.  She was started on low-dose amlodipine given elevated blood pressure reading in clinic today as well as previously elevated readings.  Discussed that this medication can make her dizzy and she needs to take this while at home and drink plenty of fluid.  She does not have a primary care provider so we will try to establish her with a PCP.  She was started on baclofen to help with headache symptoms with instruction not to drive or drink alcohol with this medication as drowsiness is a common side effect.  She was given prednisone burst to try to break headache cycle with instruction not to take NSAIDs with this medication due to risk of GI bleeding.  She can take Tylenol for persistent symptoms.  Discussed that if symptoms persist especially after blood pressure improves she would need imaging which cannot be arranged through PCP.  Discussed alarm symptoms that warrant emergent evaluation.  Strict return precautions given to patient expressed understanding.  She was instructed to follow-up with our clinic if she is not seeing PCP within 5 to 7 days to ensure improvement of symptoms and for blood pressure recheck.  Final Clinical Impressions(s) / UC Diagnoses   Final diagnoses:  Nonintractable headache, unspecified chronicity pattern, unspecified headache type  Essential hypertension  Elevated blood pressure reading  Neck pain     Discharge Instructions  I am concerned that your blood pressure is contributing to your headaches.  Please start amlodipine 5 mg daily.  This can make you dizzy so please be careful when taking it.  I have called in baclofen they can take twice a day as needed.  This will make you sleepy so do not drive or drink alcohol with it.  Start prednisone burst (40 mg x 3 days).  Do not take NSAIDs including aspirin, ibuprofen/Advil, naproxen/Aleve with this medication due  to risk of GI bleeding.  If you continue to have headaches or develop any additional symptoms including chest pain, shortness of breath, leg swelling, vision changes you need to go to the ER.  Please follow-up with our clinic or PCP within a few days to ensure symptom improvement and improvement of blood pressure.  Avoid salt and decongestants.  Keep track of your blood pressure at home and keep log for evaluation of follow-up appointment.  If anything worsens please return for reevaluation.     ED Prescriptions     Medication Sig Dispense Auth. Provider   amLODipine (NORVASC) 5 MG tablet Take 1 tablet (5 mg total) by mouth daily. 30 tablet Kamica Florance K, PA-C   baclofen (LIORESAL) 10 MG tablet Take 1 tablet (10 mg total) by mouth 2 (two) times daily as needed for muscle spasms. 20 tablet Eniola Cerullo K, PA-C   predniSONE (DELTASONE) 20 MG tablet Take 2 tablets (40 mg total) by mouth daily with breakfast for 3 days. 6 tablet Jerral Mccauley, Noberto Retort, PA-C      PDMP not reviewed this encounter.   Jeani Hawking, PA-C 05/20/21 1725

## 2021-05-20 NOTE — ED Triage Notes (Signed)
Pt reports headache x 1 week. Reports she feels burning sensation in the throat and head on and off and the started feeling dizzy x 1 week. Denies chest pain, vision changes, shortness of breath.   Reports she had nausea, vomiting, diarrhea x 3 days after eating shrimp.  Reports she went to the ED yesterday and felt better after Toradol and Reglan injection.

## 2021-05-20 NOTE — ED Notes (Signed)
Pt leaving AMA, advised to return if symptoms worsen. 

## 2021-05-21 ENCOUNTER — Telehealth: Payer: Self-pay

## 2021-05-21 NOTE — Telephone Encounter (Signed)
Transition Care Management Unsuccessful Follow-up Telephone Call  Date of discharge and from where:  05/20/2021 from [facility]  Attempts:  1st Attempt  Reason for unsuccessful TCM follow-up call:  Left voice message    

## 2021-05-24 NOTE — Telephone Encounter (Signed)
Transition Care Management Unsuccessful Follow-up Telephone Call  Date of discharge and from where:  05/20/2021 from [facility]  Attempts:  2nd Attempt  Reason for unsuccessful TCM follow-up call:  Unable to leave message

## 2021-05-25 NOTE — Telephone Encounter (Signed)
Transition Care Management Unsuccessful Follow-up Telephone Call  Date of discharge and from where:  05/20/2021 from [facility]  Attempts:  3rd Attempt  Reason for unsuccessful TCM follow-up call:  Unable to reach patient

## 2021-05-31 ENCOUNTER — Encounter: Payer: Self-pay | Admitting: *Deleted

## 2023-09-11 ENCOUNTER — Other Ambulatory Visit: Payer: Self-pay

## 2023-09-11 ENCOUNTER — Emergency Department (HOSPITAL_COMMUNITY): Payer: Medicaid Other

## 2023-09-11 ENCOUNTER — Emergency Department (HOSPITAL_COMMUNITY)
Admission: EM | Admit: 2023-09-11 | Discharge: 2023-09-12 | Disposition: A | Discharge status: Home / Self Care | Payer: Medicaid Other | Attending: Emergency Medicine | Admitting: Emergency Medicine

## 2023-09-11 ENCOUNTER — Encounter (HOSPITAL_COMMUNITY): Payer: Self-pay | Admitting: Emergency Medicine

## 2023-09-11 DIAGNOSIS — M79671 Pain in right foot: Secondary | ICD-10-CM | POA: Diagnosis present

## 2023-09-11 DIAGNOSIS — Z79899 Other long term (current) drug therapy: Secondary | ICD-10-CM | POA: Insufficient documentation

## 2023-09-11 DIAGNOSIS — M7989 Other specified soft tissue disorders: Secondary | ICD-10-CM | POA: Diagnosis not present

## 2023-09-11 DIAGNOSIS — S92514A Nondisplaced fracture of proximal phalanx of right lesser toe(s), initial encounter for closed fracture: Secondary | ICD-10-CM

## 2023-09-11 NOTE — ED Provider Notes (Signed)
Accepted handoff at shift change from [facility]. Please see prior provider note for full HPI.  Briefly: Patient is a 47 y.o. female who presents to the ER for right 5th toe pain after patient's daughter stepped on her foot.  DDX/Plan: Follow up on XR  Physical Exam  BP (!) 171/84 (BP Location: Left Arm)   Pulse (!) 58   Resp 17   Ht 5\' 2"  (1.575 m)   Wt 101 kg   SpO2 98%   BMI 40.73 kg/m   Physical Exam Vitals and nursing note reviewed.  Constitutional:      Appearance: Normal appearance.  HENT:     Head: Normocephalic and atraumatic.  Eyes:     Conjunctiva/sclera: Conjunctivae normal.  Pulmonary:     Effort: Pulmonary effort is normal. No respiratory distress.  Feet:     Comments: Right little toe with some swelling and tenderness, normal capillary refill, normal sensation Skin:    General: Skin is warm and dry.  Neurological:     Mental Status: She is alert.  Psychiatric:        Mood and Affect: Mood normal.        Behavior: Behavior normal.    Results  DG Foot Complete Right  Result Date: 09/12/2023 CLINICAL DATA:  Small toe pain.  Daughter stepped on her foot. EXAM: RIGHT FOOT COMPLETE - 3+ VIEW COMPARISON:  None Available. FINDINGS: There is a nondisplaced acute transverse fracture of the proximal shaft of the small toe proximal phalanx. Normal bone mineralization. There is no evidence of arthropathy or other focal bone abnormality. There is mild soft tissue swelling in the forefoot and fifth toe. IMPRESSION: Nondisplaced acute closed transverse fracture of the proximal shaft of the 5th toe proximal phalanx. Electronically Signed   By: Almira Bar M.D.   On: 09/12/2023 00:24    ED Course / MDM    Medical Decision Making Amount and/or Complexity of Data Reviewed Radiology: ordered.  I reviewed and interpreted imaging of the R foot which is significant for nondisplaced fracture of the proximal phalanx of the 5th toe. I agree with the radiologist interpretation.    Right 4th and 5th toes buddy taped. Patient and her daughter updated. Will recommend RICE method and OTC meds, orthopedic follow up if no better in > 1 week. Patient and her daughter agreeable to the plan. Discharged in stable condition.    Jeanella Flattery 09/12/23 0053    Zadie Rhine, MD 09/12/23 [phone]

## 2023-09-11 NOTE — ED Triage Notes (Signed)
Pt c/o right foot pain mostly in her 5th toe after her daughter stepped on her foot.

## 2023-09-11 NOTE — ED Provider Notes (Signed)
[facility] EMERGENCY DEPARTMENT AT [facility] Provider Note   CSN: [id number] Arrival date & time: 09/11/23  2149     History  Chief Complaint  Patient presents with   Foot Pain    Caitlin Velasquez is a 47 y.o. female with no significant past medical history presents today for right foot pain.  Patient's daughter states that her little sister jumped off the bed and landed on the patient's toe.  The patient was icing her toe and took a nap prior to work and when she was getting ready for work she realized she could not walk while on it.  Patient denies numbness.  Patient endorses pain.   Foot Pain       Home Medications Prior to Admission medications   Medication Sig Start Date End Date Taking? Authorizing Provider  amLODipine (NORVASC) 5 MG tablet Take 1 tablet (5 mg total) by mouth daily. 05/20/21   Raspet, Noberto Retort, PA-C  baclofen (LIORESAL) 10 MG tablet Take 1 tablet (10 mg total) by mouth 2 (two) times daily as needed for muscle spasms. 05/20/21   Raspet, Noberto Retort, PA-C  cetirizine (ZYRTEC ALLERGY) 10 MG tablet Take 1 tablet (10 mg total) by mouth daily. 05/07/21   Wallis Bamberg, PA-C      Allergies    Patient has no known allergies.    Review of Systems   Review of Systems  Musculoskeletal:  Positive for arthralgias.    Physical Exam Updated Vital Signs BP (!) 171/84 (BP Location: Left Arm)   Pulse (!) 58   Resp 17   Ht 5\' 2"  (1.575 m)   Wt 101 kg   SpO2 98%   BMI 40.73 kg/m  Physical Exam Vitals and nursing note reviewed.  Constitutional:      General: She is not in acute distress.    Appearance: She is well-developed.  HENT:     Head: Normocephalic and atraumatic.  Eyes:     Conjunctiva/sclera: Conjunctivae normal.  Cardiovascular:     Rate and Rhythm: Normal rate and regular rhythm.     Pulses:          Dorsalis pedis pulses are 2+ on the right side.     Heart sounds: No murmur heard. Pulmonary:     Effort: Pulmonary effort is normal. No  respiratory distress.     Breath sounds: Normal breath sounds.  Abdominal:     Palpations: Abdomen is soft.     Tenderness: There is no abdominal tenderness.  Musculoskeletal:        General: No swelling.     Cervical back: Neck supple.     Right foot: Normal range of motion.  Feet:     Comments: Mild swelling of the left pinky toe.  Range of motion intact, +2 dorsalis pedis pulse patient Dors is pain at the right fifth metatarsal. Skin:    General: Skin is warm and dry.     Capillary Refill: Capillary refill takes less than 2 seconds.  Neurological:     Mental Status: She is alert.  Psychiatric:        Mood and Affect: Mood normal.     ED Results / Procedures / Treatments   Labs (all labs ordered are listed, but only abnormal results are displayed) Labs Reviewed - No data to display  EKG None  Radiology No results found.  Procedures Procedures    Medications Ordered in ED Medications - No data to display  ED Course/ Medical  Decision Making/ A&P                                 Medical Decision Making Amount and/or Complexity of Data Reviewed Radiology: ordered.   This patient presents to the ED with chief complaint(s) of foot pain with pertinent past medical history of none which further complicates the presenting complaint. The complaint involves an extensive differential diagnosis and also carries with it a high risk of complications and morbidity.    The differential diagnosis includes musculoskeletal pain, metatarsal fracture  ED Course and Reassessment:   Independent visualization of imaging: - I independently visualized the following imaging with scope of interpretation limited to determining acute life threatening conditions related to emergency care: Right foot x-ray, which revealed pending  Consultation: - Consulted or discussed management/test interpretation w/ external professional: None  Consideration for admission or further workup:  Patient  signed out to PA Roemhildt at shift change        Final Clinical Impression(s) / ED Diagnoses Final diagnoses:  None    Rx / DC Orders ED Discharge Orders     None         Dolphus Jenny, PA-C 09/11/23 2350    Glyn Ade, MD 09/14/23 1520

## 2023-09-12 NOTE — Discharge Instructions (Signed)
You were seen in the ER for foot pain.  As we discussed, you fractured your right pinky toe. This is normally treated with immobilization and taking over the counter medications for pain. I recommend wearing a hard sole shoe for support as well.  If your pain persists for greater than a week, you may want to follow up with an orthopedist, and I have attached their contact information for you to call and make an appointment.  Continue to monitor how you're doing and return to the ER for new or worsening symptoms. ________ Caitlin Velasquez atendieron en urgencias por dolor en el pie.  Como comentamos, te fracturaste el dedo meique del [place]. Esto normalmente se trata con inmovilizacin y tomando medicamentos de venta libre para [employer]. Tambin recomiendo usar un zapato de suela dura como soporte.  Si su dolor persiste durante ms de [employer], es posible que desee consultar a un ortopedista. Adjunto su informacin de contacto para que llame y programe una cita.  Contine controlando cmo se encuentra y regrese a la sala de emergencias si los sntomas aparecen o empeoran.
